# Patient Record
Sex: Male | Born: 1955
Health system: Southern US, Community
[De-identification: ages and names within clinical notes are randomized; demographics above are authoritative.]

## PROBLEM LIST (undated history)

## (undated) DIAGNOSIS — I6521 Occlusion and stenosis of right carotid artery: Secondary | ICD-10-CM

## (undated) DIAGNOSIS — E786 Lipoprotein deficiency: Secondary | ICD-10-CM

## (undated) DIAGNOSIS — R0789 Other chest pain: Secondary | ICD-10-CM

## (undated) DIAGNOSIS — I10 Essential (primary) hypertension: Secondary | ICD-10-CM

## (undated) DIAGNOSIS — K5790 Diverticulosis of intestine, part unspecified, without perforation or abscess without bleeding: Secondary | ICD-10-CM

## (undated) DIAGNOSIS — E785 Hyperlipidemia, unspecified: Secondary | ICD-10-CM

## (undated) DIAGNOSIS — E119 Type 2 diabetes mellitus without complications: Secondary | ICD-10-CM

## (undated) HISTORY — DX: Type 2 diabetes mellitus without complications: E11.9

## (undated) HISTORY — DX: Diverticulosis of intestine, part unspecified, without perforation or abscess without bleeding: K57.90

## (undated) HISTORY — DX: Hyperlipidemia, unspecified: E78.5

## (undated) HISTORY — PX: APPENDECTOMY: SHX54

## (undated) HISTORY — DX: Other chest pain: R07.89

## (undated) HISTORY — DX: Occlusion and stenosis of right carotid artery: I65.21

## (undated) HISTORY — DX: Lipoprotein deficiency: E78.6

## (undated) HISTORY — PX: COLON SURGERY: SHX602

## (undated) HISTORY — DX: Essential (primary) hypertension: I10

---

## 2016-10-06 DIAGNOSIS — E785 Hyperlipidemia, unspecified: Secondary | ICD-10-CM

## 2016-10-06 HISTORY — DX: Hyperlipidemia, unspecified: E78.5

## 2018-06-06 DIAGNOSIS — E786 Lipoprotein deficiency: Secondary | ICD-10-CM | POA: Insufficient documentation

## 2018-06-06 DIAGNOSIS — E119 Type 2 diabetes mellitus without complications: Secondary | ICD-10-CM

## 2018-06-06 DIAGNOSIS — R0789 Other chest pain: Secondary | ICD-10-CM

## 2018-06-06 DIAGNOSIS — K5790 Diverticulosis of intestine, part unspecified, without perforation or abscess without bleeding: Secondary | ICD-10-CM | POA: Insufficient documentation

## 2018-06-06 DIAGNOSIS — I6521 Occlusion and stenosis of right carotid artery: Secondary | ICD-10-CM | POA: Insufficient documentation

## 2018-06-06 DIAGNOSIS — I10 Essential (primary) hypertension: Secondary | ICD-10-CM

## 2018-06-06 HISTORY — DX: Other chest pain: R07.89

## 2018-06-06 HISTORY — DX: Essential (primary) hypertension: I10

## 2018-06-06 HISTORY — DX: Type 2 diabetes mellitus without complications: E11.9

## 2018-06-06 HISTORY — DX: Diverticulosis of intestine, part unspecified, without perforation or abscess without bleeding: K57.90

## 2018-06-06 HISTORY — DX: Lipoprotein deficiency: E78.6

## 2018-06-06 HISTORY — DX: Occlusion and stenosis of right carotid artery: I65.21

## 2018-06-25 NOTE — Progress Notes (Deleted)
Cardiology Office Note:    Date:  06/25/2018   ID:  Gary Hoover, DOB 12/12/1956, MRN [id number]  PCP:  Patient, No Pcp Per  Cardiologist:  Shirlee More, MD    Referring MD: No ref. provider found    ASSESSMENT:    No diagnosis found. PLAN:    In order of problems listed above:  1. ***   Next appointment: ***   Medication Adjustments/Labs and Tests Ordered: Current medicines are reviewed at length with the patient today.  Concerns regarding medicines are outlined above.  No orders of the defined types were placed in this encounter.  No orders of the defined types were placed in this encounter.   No chief complaint on file.   History of Present Illness:    Gary Hoover is a 62 y.o. male with a hx of dyslipidemia, hypertension type 2 diabetes mellitus and atypical chest pain last seen 10/09/17 at [facility]. Compliance with diet, lifestyle and medications: *** Past Medical History:  Diagnosis Date  . Atherosclerosis of right carotid artery 06/06/2018  . Atypical chest pain 06/06/2018  . Diverticular disease 06/06/2018  . Hyperlipemia 10/06/2016   Statin intolerant  . Hypertension 06/06/2018  . Low level of high density lipoprotein (HDL) 06/06/2018  . Type 2 diabetes mellitus ([place]) 06/06/2018    *** The histories are not reviewed yet. Please review them in the "History" navigator section and refresh this [place].  Current Medications: No outpatient medications have been marked as taking for the 06/26/18 encounter (Appointment) with Richardo Priest, MD.     Allergies:   Patient has no allergy information on record.   Social History   Socioeconomic History  . Marital status: Married    Spouse name: Not on file  . Number of children: Not on file  . Years of education: Not on file  . Highest education level: Not on file  Occupational History  . Not on file  Social Needs  . Financial resource strain: Not on file  . Food insecurity:    Worry: Not on  file    Inability: Not on file  . Transportation needs:    Medical: Not on file    Non-medical: Not on file  Tobacco Use  . Smoking status: Not on file  Substance and Sexual Activity  . Alcohol use: Not on file  . Drug use: Not on file  . Sexual activity: Not on file  Lifestyle  . Physical activity:    Days per week: Not on file    Minutes per session: Not on file  . Stress: Not on file  Relationships  . Social connections:    Talks on phone: Not on file    Gets together: Not on file    Attends religious service: Not on file    Active member of club or organization: Not on file    Attends meetings of clubs or organizations: Not on file    Relationship status: Not on file  Other Topics Concern  . Not on file  Social History Narrative  . Not on file     Family History: The patient's ***family history is not on file. ROS:   Please see the history of present illness.    All other systems reviewed and are negative.  EKGs/Labs/Other Studies Reviewed:    The following studies were reviewed today:  EKG:  EKG ordered today.  The ekg ordered today demonstrates ***  Recent Labs: No results found for requested labs within last 8760  hours.  Recent Lipid Panel No results found for: CHOL, TRIG, HDL, CHOLHDL, VLDL, LDLCALC, LDLDIRECT  Physical Exam:    VS:  There were no vitals taken for this visit.    Wt Readings from Last 3 Encounters:  No data found for Wt     GEN: *** Well nourished, well developed in no acute distress HEENT: Normal NECK: No JVD; No carotid bruits LYMPHATICS: No lymphadenopathy CARDIAC: ***RRR, no murmurs, rubs, gallops RESPIRATORY:  Clear to auscultation without rales, wheezing or rhonchi  ABDOMEN: Soft, non-tender, non-distended MUSCULOSKELETAL:  No edema; No deformity  SKIN: Warm and dry NEUROLOGIC:  Alert and oriented x 3 PSYCHIATRIC:  Normal affect    Signed, Shirlee More, MD  06/25/2018 7:56 AM    [place]

## 2018-06-26 ENCOUNTER — Ambulatory Visit: Payer: Self-pay | Admitting: Cardiology

## 2018-06-27 ENCOUNTER — Encounter: Payer: Self-pay | Admitting: Cardiology

## 2018-06-27 ENCOUNTER — Ambulatory Visit (INDEPENDENT_AMBULATORY_CARE_PROVIDER_SITE_OTHER): Payer: Self-pay | Admitting: Cardiology

## 2018-06-27 VITALS — BP 118/76 | HR 73 | Ht 66.0 in | Wt 149.0 lb

## 2018-06-27 DIAGNOSIS — I10 Essential (primary) hypertension: Secondary | ICD-10-CM

## 2018-06-27 DIAGNOSIS — E785 Hyperlipidemia, unspecified: Secondary | ICD-10-CM

## 2018-06-27 NOTE — Progress Notes (Signed)
Cardiology Office Note:    Date:  06/27/2018   ID:  Gary Hoover, DOB [birth year], MRN [id number]  PCP:  Gary Dress, MD  Cardiologist:  Shirlee More, MD    Referring MD: No ref. provider found    ASSESSMENT:    1. Dyslipidemia   2. Essential hypertension    PLAN:    In order of problems listed above:  His lipid disorder is low HDL took a statin in the past and attributes of bowel perforation to it and will not accept lipid-lowering for treatment.  We discussed performing a advanced lipid profile and lipoprotein a high sensitive CRP he is pleased with his response to lifestyle modification weight loss and exercise and will hold at this time.  We discussed the potential treatment of the future with CTEP agent 1 of which is pending approval at the FDA  Hypertension stable at target with weight loss and exercise does not require antihypertensive drug therapy at this time  Next appointment: One year   Medication Adjustments/Labs and Tests Ordered: Current medicines are reviewed at length with the patient today.  Concerns regarding medicines are outlined above.  Orders Placed This Encounter  Procedures  . EKG 12-Lead   No orders of the defined types were placed in this encounter.   Chief Complaint  Patient presents with  . Hyperlipidemia    History of Present Illness:    Gary Hoover is a 62 y.o. male with a hx of dyslipidemia low HDL and high blood pressure last seen more than 3 years ago. Compliance with diet, lifestyle and medications: Yes  He is retired has lost weight exercising regularly pleased with the quality of his life no chest pain dyspnea palpitations syncope or TIA recent labs requested from his PCP office he tells me his total cholesterol and LDL are good still has low HDL level. Past Medical History:  Diagnosis Date  . Atherosclerosis of right carotid artery 06/06/2018  . Atypical chest pain 06/06/2018  . Diverticular disease 06/06/2018  .  Hyperlipemia 10/06/2016   Statin intolerant  . Hypertension 06/06/2018  . Low level of high density lipoprotein (HDL) 06/06/2018  . Type 2 diabetes mellitus ([place]) 06/06/2018    Past Surgical History:  Procedure Laterality Date  . APPENDECTOMY    . COLON SURGERY      Current Medications: Current Meds  Medication Sig  . aspirin EC 81 MG tablet Take 1 tablet by mouth daily.     Allergies:   Patient has no known allergies.   Social History   Socioeconomic History  . Marital status: Married    Spouse name: Not on file  . Number of children: Not on file  . Years of education: Not on file  . Highest education level: Not on file  Occupational History  . Not on file  Social Needs  . Financial resource strain: Not on file  . Food insecurity:    Worry: Not on file    Inability: Not on file  . Transportation needs:    Medical: Not on file    Non-medical: Not on file  Tobacco Use  . Smoking status: Never Smoker  . Smokeless tobacco: Never Used  Substance and Sexual Activity  . Alcohol use: Not on file  . Drug use: Not on file  . Sexual activity: Not on file  Lifestyle  . Physical activity:    Days per week: Not on file    Minutes per session: Not on file  . Stress:  Not on file  Relationships  . Social connections:    Talks on phone: Not on file    Gets together: Not on file    Attends religious service: Not on file    Active member of club or organization: Not on file    Attends meetings of clubs or organizations: Not on file    Relationship status: Not on file  Other Topics Concern  . Not on file  Social History Narrative  . Not on file     Family History: The patient's family history includes Breast cancer in his mother; CAD in his father; Diabetes in his mother; Hypertension in his father and mother. ROS:   Please see the history of present illness.    All other systems reviewed and are negative.  EKGs/Labs/Other Studies Reviewed:    The following studies  were reviewed today:  EKG:  EKG ordered today.  The ekg ordered today demonstrates sinus rhythm normal  Recent Labs: No results found for requested labs within last 8760 hours.  Recent Lipid Panel No results found for: CHOL, TRIG, HDL, CHOLHDL, VLDL, LDLCALC, LDLDIRECT  Physical Exam:    VS:  BP 118/76 (BP Location: Right Arm, Patient Position: Sitting, Cuff Size: Normal)   Pulse 73   Ht 5\' 6"  (1.676 m)   Wt 149 lb (67.6 kg)   SpO2 98%   BMI 24.05 kg/m     Wt Readings from Last 3 Encounters:  06/27/18 149 lb (67.6 kg)     GEN:  Well nourished, well developed in no acute distress HEENT: Normal NECK: No JVD; No carotid bruits LYMPHATICS: No lymphadenopathy CARDIAC: RRR, no murmurs, rubs, gallops RESPIRATORY:  Clear to auscultation without rales, wheezing or rhonchi  ABDOMEN: Soft, non-tender, non-distended MUSCULOSKELETAL:  No edema; No deformity  SKIN: Warm and dry NEUROLOGIC:  Alert and oriented x 3 PSYCHIATRIC:  Normal affect    Signed, Shirlee More, MD  06/27/2018 5:02 PM    [facility]

## 2018-06-27 NOTE — Patient Instructions (Signed)
Medication Instructions:  Your physician recommends that you continue on your current medications as directed. Please refer to the Current Medication list given to you today.  Labwork: None  Testing/Procedures: None  Follow-Up: Your physician recommends that you schedule a follow-up appointment in: 1 year  Any Other Special Instructions Will Be Listed Below (If Applicable).     If you need a refill on your cardiac medications before your next appointment, please call your pharmacy.   [facility]  Ashley A, RN, BSN  

## 2019-06-26 ENCOUNTER — Ambulatory Visit: Payer: Self-pay | Admitting: Cardiology

## 2019-09-17 DIAGNOSIS — Z23 Encounter for immunization: Secondary | ICD-10-CM | POA: Diagnosis not present

## 2019-09-26 DIAGNOSIS — E785 Hyperlipidemia, unspecified: Secondary | ICD-10-CM | POA: Diagnosis not present

## 2019-09-26 DIAGNOSIS — Z1211 Encounter for screening for malignant neoplasm of colon: Secondary | ICD-10-CM | POA: Diagnosis not present

## 2019-09-26 DIAGNOSIS — Z9181 History of falling: Secondary | ICD-10-CM | POA: Diagnosis not present

## 2019-09-26 DIAGNOSIS — Z0000 Encounter for general adult medical examination without abnormal findings: Secondary | ICD-10-CM | POA: Diagnosis not present

## 2019-09-26 DIAGNOSIS — R7301 Impaired fasting glucose: Secondary | ICD-10-CM | POA: Diagnosis not present

## 2019-12-10 DIAGNOSIS — Z20828 Contact with and (suspected) exposure to other viral communicable diseases: Secondary | ICD-10-CM | POA: Diagnosis not present

## 2019-12-10 DIAGNOSIS — R519 Headache, unspecified: Secondary | ICD-10-CM | POA: Diagnosis not present

## 2019-12-10 DIAGNOSIS — R070 Pain in throat: Secondary | ICD-10-CM | POA: Diagnosis not present

## 2020-01-07 DIAGNOSIS — K219 Gastro-esophageal reflux disease without esophagitis: Secondary | ICD-10-CM | POA: Diagnosis not present

## 2020-01-07 DIAGNOSIS — K5730 Diverticulosis of large intestine without perforation or abscess without bleeding: Secondary | ICD-10-CM | POA: Diagnosis not present

## 2020-01-13 DIAGNOSIS — Z1159 Encounter for screening for other viral diseases: Secondary | ICD-10-CM | POA: Diagnosis not present

## 2020-01-13 DIAGNOSIS — Z20828 Contact with and (suspected) exposure to other viral communicable diseases: Secondary | ICD-10-CM | POA: Diagnosis not present

## 2020-01-20 DIAGNOSIS — Z1211 Encounter for screening for malignant neoplasm of colon: Secondary | ICD-10-CM | POA: Diagnosis not present

## 2020-01-20 DIAGNOSIS — Z9049 Acquired absence of other specified parts of digestive tract: Secondary | ICD-10-CM | POA: Diagnosis not present

## 2020-01-20 DIAGNOSIS — D126 Benign neoplasm of colon, unspecified: Secondary | ICD-10-CM | POA: Diagnosis not present

## 2020-01-20 DIAGNOSIS — Z7982 Long term (current) use of aspirin: Secondary | ICD-10-CM | POA: Diagnosis not present

## 2020-01-20 DIAGNOSIS — K5730 Diverticulosis of large intestine without perforation or abscess without bleeding: Secondary | ICD-10-CM | POA: Diagnosis not present

## 2020-02-12 ENCOUNTER — Other Ambulatory Visit: Payer: Self-pay

## 2020-02-12 ENCOUNTER — Encounter: Payer: Self-pay | Admitting: Cardiology

## 2020-02-12 ENCOUNTER — Ambulatory Visit (INDEPENDENT_AMBULATORY_CARE_PROVIDER_SITE_OTHER): Payer: PPO | Admitting: Cardiology

## 2020-02-12 VITALS — BP 110/62 | HR 63 | Temp 97.7°F | Ht 66.0 in | Wt 153.0 lb

## 2020-02-12 DIAGNOSIS — R0789 Other chest pain: Secondary | ICD-10-CM

## 2020-02-12 DIAGNOSIS — E785 Hyperlipidemia, unspecified: Secondary | ICD-10-CM

## 2020-02-12 NOTE — Progress Notes (Signed)
Cardiology Office Note:    Date:  02/12/2020   ID:  [name], DOB 1955/12/31, MRN [id number]  PCP:  Nicoletta Dress, MD  Cardiologist:  Shirlee More, MD    Referring MD: Nicoletta Dress, MD    ASSESSMENT:    1. Dyslipidemia   2. Other chest pain    PLAN:    In order of problems listed above:  1. We are trying to decide whether he is closer to mild to moderate cardiovascular risk of moderate to severe receptive impact decision making for alternate therapy like PCSK9 CT calcium score performed score is greater than 10 initiate therapy.   Next appointment: 1 year   Medication Adjustments/Labs and Tests Ordered: Current medicines are reviewed at length with the patient today.  Concerns regarding medicines are outlined above.  Orders Placed This Encounter  Procedures  . CT CARDIAC SCORING  . EKG 12-Lead   No orders of the defined types were placed in this encounter.   No chief complaint on file.   History of Present Illness:    Gary Hoover is a 64 y.o. male with a hx of dyslipidemia low HDL and high blood pressure  last seen 06/27/2018. Compliance with diet, lifestyle and medications: Yes  He has had a good year no chest pain shortness of breath palpitation or syncope.  His most recent lipid profile performed in his PCP office 09/26/2019 shows cholesterol 243 HDL 39 LDL 151 with normal renal function.  He is resistant to lipid-lowering therapy as he attributes his bowel preparation to a statin and says he has been intolerant of even a low intensity statin pravastatin due to muscle.  To better define his risk we will do a cardiac CT calcium score is less than 10 avoid lipid-lowering therapy and if greater he would benefit and I would utilize PCSK9 therapy. Past Medical History:  Diagnosis Date  . Atherosclerosis of right carotid artery 06/06/2018  . Atypical chest pain 06/06/2018  . Diverticular disease 06/06/2018  . Hyperlipemia 10/06/2016   Statin intolerant   . Hypertension 06/06/2018  . Low level of high density lipoprotein (HDL) 06/06/2018  . Type 2 diabetes mellitus ([place]) 06/06/2018    Past Surgical History:  Procedure Laterality Date  . APPENDECTOMY    . COLON SURGERY      Current Medications: Current Meds  Medication Sig  . aspirin EC 81 MG tablet Take 1 tablet by mouth daily.     Allergies:   Patient has no known allergies.   Social History   Socioeconomic History  . Marital status: Married    Spouse name: Not on file  . Number of children: Not on file  . Years of education: Not on file  . Highest education level: Not on file  Occupational History  . Not on file  Tobacco Use  . Smoking status: Never Smoker  . Smokeless tobacco: Never Used  Substance and Sexual Activity  . Alcohol use: Yes    Alcohol/week: 1.0 standard drinks    Types: 1 Glasses of wine per week  . Drug use: Never  . Sexual activity: Not Currently  Other Topics Concern  . Not on file  Social History Narrative  . Not on file   Social Determinants of Health   Financial Resource Strain:   . Difficulty of Paying Living Expenses: Not on file  Food Insecurity:   . Worried About Charity fundraiser in the Last Year: Not on file  . Ran Out of  Food in the Last Year: Not on file  Transportation Needs:   . Lack of Transportation (Medical): Not on file  . Lack of Transportation (Non-Medical): Not on file  Physical Activity:   . Days of Exercise per Week: Not on file  . Minutes of Exercise per Session: Not on file  Stress:   . Feeling of Stress : Not on file  Social Connections:   . Frequency of Communication with Friends and Family: Not on file  . Frequency of Social Gatherings with Friends and Family: Not on file  . Attends Religious Services: Not on file  . Active Member of Clubs or Organizations: Not on file  . Attends Archivist Meetings: Not on file  . Marital Status: Not on file     Family History: The patient's family history  includes Breast cancer in his mother; CAD in his father; Diabetes in his mother; Hypertension in his father and mother. ROS:   Please see the history of present illness.    All other systems reviewed and are negative.  EKGs/Labs/Other Studies Reviewed:    The following studies were reviewed today:  EKG:  EKG ordered today and personally reviewed.  The ekg ordered today demonstrates sinus rhythm and remains normal    Physical Exam:    VS:  BP 110/62   Pulse 63   Temp 97.7 F (36.5 C)   Ht 5\' 6"  (1.676 m)   Wt 153 lb (69.4 kg)   SpO2 98%   BMI 24.69 kg/m     Wt Readings from Last 3 Encounters:  02/12/20 153 lb (69.4 kg)  06/27/18 149 lb (67.6 kg)     GEN:  Well nourished, well developed in no acute distress HEENT: Normal NECK: No JVD; No carotid bruits LYMPHATICS: No lymphadenopathy CARDIAC: RRR, no murmurs, rubs, gallops RESPIRATORY:  Clear to auscultation without rales, wheezing or rhonchi  ABDOMEN: Soft, non-tender, non-distended MUSCULOSKELETAL:  No edema; No deformity  SKIN: Warm and dry NEUROLOGIC:  Alert and oriented x 3 PSYCHIATRIC:  Normal affect    Signed, Shirlee More, MD  02/12/2020 2:29 PM    [place]

## 2020-02-12 NOTE — Patient Instructions (Signed)
Medication Instructions:  Your physician recommends that you continue on your current medications as directed. Please refer to the Current Medication list given to you today.  *If you need a refill on your cardiac medications before your next appointment, please call your pharmacy*  Lab Work: None ordered  If you have labs (blood work) drawn today and your tests are completely normal, you will receive your results only by: Marland Kitchen MyChart Message (if you have MyChart) OR . A paper copy in the mail If you have any lab test that is abnormal or we need to change your treatment, we will call you to review the results.  Testing/Procedures: Your physician has recommended you have a Cardiac CT Calcium Scoring done at our [facility]. Location in [place], [place]: At [facility], you and your health needs are our priority.  As part of our continuing mission to provide you with exceptional heart care, we have created designated Provider Care Teams.  These Care Teams include your primary Cardiologist (physician) and Advanced Practice Providers (APPs -  Physician Assistants and Nurse Practitioners) who all work together to provide you with the care you need, when you need it.  Your next appointment:   12 month(s)  The format for your next appointment:   In Person  Provider:   Shirlee More, MD  Other Instructions

## 2020-08-19 DIAGNOSIS — Z1331 Encounter for screening for depression: Secondary | ICD-10-CM | POA: Diagnosis not present

## 2020-08-19 DIAGNOSIS — Z0000 Encounter for general adult medical examination without abnormal findings: Secondary | ICD-10-CM | POA: Diagnosis not present

## 2020-08-19 DIAGNOSIS — Z139 Encounter for screening, unspecified: Secondary | ICD-10-CM | POA: Diagnosis not present

## 2020-08-19 DIAGNOSIS — E785 Hyperlipidemia, unspecified: Secondary | ICD-10-CM | POA: Diagnosis not present

## 2020-08-19 DIAGNOSIS — Z9181 History of falling: Secondary | ICD-10-CM | POA: Diagnosis not present

## 2020-10-22 DIAGNOSIS — Z23 Encounter for immunization: Secondary | ICD-10-CM | POA: Diagnosis not present

## 2020-12-04 DIAGNOSIS — Z6825 Body mass index (BMI) 25.0-25.9, adult: Secondary | ICD-10-CM | POA: Diagnosis not present

## 2020-12-04 DIAGNOSIS — B9689 Other specified bacterial agents as the cause of diseases classified elsewhere: Secondary | ICD-10-CM | POA: Diagnosis not present

## 2020-12-04 DIAGNOSIS — J019 Acute sinusitis, unspecified: Secondary | ICD-10-CM | POA: Diagnosis not present

## 2021-05-11 NOTE — Progress Notes (Signed)
Cardiology Office Note:    Date:  05/12/2021   ID:  Gary Hoover, DOB [birth year], MRN [id number]  PCP:  Nicoletta Dress, MD  Cardiologist:  Shirlee More, MD    Referring MD: Nicoletta Dress, MD    ASSESSMENT:    1. Cardiac risk counseling   2. Primary hypertension   3. Hyperlipidemia, unspecified hyperlipidemia type    PLAN:    In order of problems listed above:  1. See below recheck lipids check coronary calcium score decision regarding statin therapy 2. New right bundle branch   Next appointment: 6 months   Medication Adjustments/Labs and Tests Ordered: Current medicines are reviewed at length with the patient today.  Concerns regarding medicines are outlined above.  Orders Placed This Encounter  Procedures  . CT CARDIAC SCORING (SELF PAY ONLY)  . Comprehensive metabolic panel  . Lipid panel  . EKG 12-Lead   No orders of the defined types were placed in this encounter.   Chief Complaint  Patient presents with  . Follow-up  . Hyperlipidemia    History of Present Illness:    Gary Hoover is a 65 y.o. male with a hx of dyslipidemia with low HDL and hypertension last seen 02/12/2020.  At his last visit I had asked him to have a coronary CT calcium score done to guide whether initiate treatment it was not performed  Most recent labs 09/26/2019: Cholesterol 243 LDL 177 triglycerides 144 HDL 39 A1c 5.7%  Compliance with diet, lifestyle and medications: Yes  In the interim-year-old [facility] screening tells me it was normal we will bring a copy to the office He has no cardiovascular symptoms of chest pain shortness of breath palpitation or syncope He never had a coronary calcium score he said he was never called for scheduling. He is still interested in refining his cardiovascular risk we will recheck a lipid profile today he has been taking over-the-counter fiber hoping that will improve his numbers exercising following a good diet and we will go ahead and  do the coronary calcium score to decide whether he should be on a statin. Past Medical History:  Diagnosis Date  . Atherosclerosis of right carotid artery 06/06/2018  . Atypical chest pain 06/06/2018  . Diverticular disease 06/06/2018  . Hyperlipemia 10/06/2016   Statin intolerant  . Hypertension 06/06/2018  . Low level of high density lipoprotein (HDL) 06/06/2018  . Type 2 diabetes mellitus ([place]) 06/06/2018    Past Surgical History:  Procedure Laterality Date  . APPENDECTOMY    . COLON SURGERY      Current Medications: Current Meds  Medication Sig  . aspirin EC 81 MG tablet Take 1 tablet by mouth daily.  . PSYLLIUM HUSK PO Take by mouth daily.     Allergies:   Patient has no known allergies.   Social History   Socioeconomic History  . Marital status: Married    Spouse name: Not on file  . Number of children: Not on file  . Years of education: Not on file  . Highest education level: Not on file  Occupational History  . Not on file  Tobacco Use  . Smoking status: Never Smoker  . Smokeless tobacco: Never Used  Vaping Use  . Vaping Use: Never used  Substance and Sexual Activity  . Alcohol use: Yes    Alcohol/week: 1.0 standard drink    Types: 1 Glasses of wine per week  . Drug use: Never  . Sexual activity: Not Currently  Other Topics  Concern  . Not on file  Social History Narrative  . Not on file   Social Determinants of Health   Financial Resource Strain: Not on file  Food Insecurity: Not on file  Transportation Needs: Not on file  Physical Activity: Not on file  Stress: Not on file  Social Connections: Not on file     Family History: The patient's family history includes Breast cancer in his mother; CAD in his father; Diabetes in his mother; Hypertension in his father and mother. ROS:   Please see the history of present illness.    All other systems reviewed and are negative.  EKGs/Labs/Other Studies Reviewed:    The following studies were reviewed  today:  EKG:  EKG ordered today and personally reviewed.  The ekg ordered today demonstrates sinus rhythm right bundle branch block new since his EKG February 2021    Physical Exam:    VS:  BP 110/78 (BP Location: Right Arm, Patient Position: Sitting, Cuff Size: Normal)   Pulse 60   Ht 5\' 6"  (1.676 m)   Wt 153 lb 3.2 oz (69.5 kg)   SpO2 98%   BMI 24.73 kg/m     Wt Readings from Last 3 Encounters:  05/12/21 153 lb 3.2 oz (69.5 kg)  02/12/20 153 lb (69.4 kg)  06/27/18 149 lb (67.6 kg)     GEN: He has no xanthoma or xanthelasma well nourished, well developed in no acute distress HEENT: Normal NECK: No JVD; No carotid bruits LYMPHATICS: No lymphadenopathy CARDIAC: RRR, no murmurs, rubs, gallops RESPIRATORY:  Clear to auscultation without rales, wheezing or rhonchi  ABDOMEN: Soft, non-tender, non-distended MUSCULOSKELETAL:  No edema; No deformity  SKIN: Warm and dry NEUROLOGIC:  Alert and oriented x 3 PSYCHIATRIC:  Normal affect    Signed, Shirlee More, MD  05/12/2021 8:21 AM    [place]

## 2021-05-12 ENCOUNTER — Other Ambulatory Visit: Payer: Self-pay

## 2021-05-12 ENCOUNTER — Encounter: Payer: Self-pay | Admitting: Cardiology

## 2021-05-12 ENCOUNTER — Ambulatory Visit: Payer: PPO | Admitting: Cardiology

## 2021-05-12 ENCOUNTER — Telehealth: Payer: Self-pay

## 2021-05-12 VITALS — BP 110/78 | HR 60 | Ht 66.0 in | Wt 153.2 lb

## 2021-05-12 DIAGNOSIS — E785 Hyperlipidemia, unspecified: Secondary | ICD-10-CM

## 2021-05-12 DIAGNOSIS — I10 Essential (primary) hypertension: Secondary | ICD-10-CM | POA: Diagnosis not present

## 2021-05-12 DIAGNOSIS — Z7189 Other specified counseling: Secondary | ICD-10-CM

## 2021-05-12 LAB — COMPREHENSIVE METABOLIC PANEL
ALT: 18 IU/L (ref 0–44)
AST: 25 IU/L (ref 0–40)
Albumin/Globulin Ratio: 2.4 — ABNORMAL HIGH (ref 1.2–2.2)
Albumin: 4.5 g/dL (ref 3.8–4.8)
Alkaline Phosphatase: 56 IU/L (ref 44–121)
BUN/Creatinine Ratio: 18 (ref 10–24)
BUN: 14 mg/dL (ref 8–27)
Bilirubin Total: 0.5 mg/dL (ref 0.0–1.2)
CO2: 23 mmol/L (ref 20–29)
Calcium: 9.3 mg/dL (ref 8.6–10.2)
Chloride: 103 mmol/L (ref 96–106)
Creatinine, Ser: 0.78 mg/dL (ref 0.76–1.27)
Globulin, Total: 1.9 g/dL (ref 1.5–4.5)
Glucose: 85 mg/dL (ref 65–99)
Potassium: 5.2 mmol/L (ref 3.5–5.2)
Sodium: 139 mmol/L (ref 134–144)
Total Protein: 6.4 g/dL (ref 6.0–8.5)
eGFR: 98 mL/min/{1.73_m2} (ref >59)

## 2021-05-12 LAB — LIPID PANEL
Chol/HDL Ratio: 6.5 ratio — ABNORMAL HIGH (ref 0.0–5.0)
Cholesterol, Total: 215 mg/dL — ABNORMAL HIGH (ref 100–199)
HDL: 33 mg/dL — ABNORMAL LOW (ref >39)
LDL Chol Calc (NIH): 166 mg/dL — ABNORMAL HIGH (ref 0–99)
Triglycerides: 87 mg/dL (ref 0–149)
VLDL Cholesterol Cal: 16 mg/dL (ref 5–40)

## 2021-05-12 NOTE — Patient Instructions (Signed)
Medication Instructions:  Your physician recommends that you continue on your current medications as directed. Please refer to the Current Medication list given to you today.  *If you need a refill on your cardiac medications before your next appointment, please call your pharmacy*   Lab Work: Your physician recommends that you return for lab work in: [place], Lipids If you have labs (blood work) drawn today and your tests are completely normal, you will receive your results only by: Marland Kitchen MyChart Message (if you have MyChart) OR . A paper copy in the mail If you have any lab test that is abnormal or we need to change your treatment, we will call you to review the results.   Testing/Procedures: I have placed the order for you to have a CT calcium score. They will call you to schedule this.    Follow-Up: At [facility], you and your health needs are our priority.  As part of our continuing mission to provide you with exceptional heart care, we have created designated Provider Care Teams.  These Care Teams include your primary Cardiologist (physician) and Advanced Practice Providers (APPs -  Physician Assistants and Nurse Practitioners) who all work together to provide you with the care you need, when you need it.  We recommend signing up for the patient portal called "MyChart".  Sign up information is provided on this After Visit Summary.  MyChart is used to connect with patients for Virtual Visits (Telemedicine).  Patients are able to view lab/test results, encounter notes, upcoming appointments, etc.  Non-urgent messages can be sent to your provider as well.   To learn more about what you can do with MyChart, go to [email or web address].    Your next appointment:   1 year(s)  The format for your next appointment:   In Person  Provider:   Shirlee More, MD   Other Instructions

## 2021-05-12 NOTE — Telephone Encounter (Signed)
-----   Message from Richardo Priest, MD sent at 05/12/2021  4:47 PM EDT ----- Lipids elevated LDL with 10% more than last year 151 Lets wait on the CT coronary calcium score to make a decision.  The remainder of his labs were good.

## 2021-05-12 NOTE — Telephone Encounter (Signed)
Left message on patients voicemail to please return our call.   

## 2021-05-13 ENCOUNTER — Telehealth: Payer: Self-pay

## 2021-05-13 NOTE — Telephone Encounter (Signed)
Spoke with patient regarding results and recommendation.  Patient verbalizes understanding and is agreeable to plan of care. Advised patient to call back with any issues or concerns.  

## 2021-05-13 NOTE — Telephone Encounter (Signed)
-----   Message from Richardo Priest, MD sent at 05/12/2021  4:47 PM EDT ----- Lipids elevated LDL with 10% more than last year 151 Lets wait on the CT coronary calcium score to make a decision.  The remainder of his labs were good.

## 2021-05-26 ENCOUNTER — Telehealth: Payer: Self-pay | Admitting: Cardiology

## 2021-05-26 ENCOUNTER — Other Ambulatory Visit: Payer: Self-pay

## 2021-05-26 ENCOUNTER — Ambulatory Visit (INDEPENDENT_AMBULATORY_CARE_PROVIDER_SITE_OTHER)
Admission: RE | Admit: 2021-05-26 | Discharge: 2021-05-26 | Disposition: A | Discharge status: Home / Self Care | Payer: Self-pay | Source: Ambulatory Visit | Attending: Cardiology | Admitting: Cardiology

## 2021-05-26 DIAGNOSIS — E785 Hyperlipidemia, unspecified: Secondary | ICD-10-CM

## 2021-05-26 DIAGNOSIS — Z7189 Other specified counseling: Secondary | ICD-10-CM

## 2021-05-26 NOTE — Progress Notes (Signed)
Spoke with patient about elevated calcium score and the recommendation to take a stating and patient states, "I will not take a statin, it caused me to be hospitalized before".  Patient is open to other suggestions.  Advised I would forward message to Dr. Bettina Gavia

## 2021-05-26 NOTE — Telephone Encounter (Signed)
   Pt said, he got his CT done today, while he was at [address] he was told he also needs blood work so he was told to go to labs and have it done. He wasn't sure if Dr. Bettina Gavia needs that. He said if he doesn't need it to call [facility] and have his blood destroyed

## 2021-06-07 ENCOUNTER — Telehealth: Payer: Self-pay | Admitting: Cardiology

## 2021-06-07 DIAGNOSIS — E785 Hyperlipidemia, unspecified: Secondary | ICD-10-CM

## 2021-06-07 NOTE — Telephone Encounter (Signed)
   Pt would like to speak with Lilia Pro regarding Lipids and medications

## 2021-06-07 NOTE — Telephone Encounter (Signed)
Spoke to the patient just now. He let me know that he is unable to take any kind of statin and he also is unable to take Zetia due to muscle symptoms that he has with them.   He is wondering if the doctor has any recommendations that are not prescription. He is looking for an all natural route. I advised on diet control but he would like to try something else as well.   I let him know that Dr. Bettina Gavia is out of the office this week and will be back on Monday. He states he is fine to wait if need be.   He also would like to know if Dr. Bettina Gavia is able to look over his lifeline paperwork that he dropped off at this office a few weeks ago. I do not recall this paperwork coming across my desk but it may have been given directly to Dr. Bettina Gavia and I will route this message to him.

## 2021-06-13 NOTE — Addendum Note (Signed)
Addended by: Resa Miner I on: 06/13/2021 08:08 AM   Modules accepted: Orders

## 2021-06-13 NOTE — Telephone Encounter (Signed)
Spoke to the patient just now and let him know Dr. Joya Gaskins recommendations. I placed the referral for him and he is agreeable with the plan of care.    Encouraged patient to call back with any questions or concerns.

## 2021-06-29 DIAGNOSIS — R1012 Left upper quadrant pain: Secondary | ICD-10-CM | POA: Diagnosis not present

## 2021-06-29 DIAGNOSIS — K5790 Diverticulosis of intestine, part unspecified, without perforation or abscess without bleeding: Secondary | ICD-10-CM | POA: Diagnosis not present

## 2021-06-29 DIAGNOSIS — K219 Gastro-esophageal reflux disease without esophagitis: Secondary | ICD-10-CM | POA: Diagnosis not present

## 2021-07-10 IMAGING — CT CT CARDIAC CORONARY ARTERY CALCIUM SCORE
3 series · 14 of 20 positions shown, 15 images · non-contrast
Comparison: 09/19/2014
COMPARISON: 09/19/2014

Addendum:
EXAM:
OVER-READ INTERPRETATION  CT CHEST

The following report is an over-read performed by radiologist Dr.
Elinesse Mesa Curbelo [REDACTED] on 05/26/2021. This over-read
does not include interpretation of cardiac or coronary anatomy or
pathology. The coronary calcium score interpretation by the
cardiologist is attached.
CLINICAL DATA: Cardiovascular Disease Risk stratification
Coronary Calcium Score
TECHNIQUE: A gated, non-contrast computed tomography scan of the heart was
performed using 3mm slice thickness. Axial images were analyzed on a
dedicated workstation. Calcium scoring of the coronary arteries was
performed using the Agatston method.

[Series 2: casc 3.0 bv41 2 bestdiast 72 % · axial · 0.43mm/px · z∈[-222,-144]mm · 4 of 44 slices shown, 5 images]
[im 9/44  vessel]
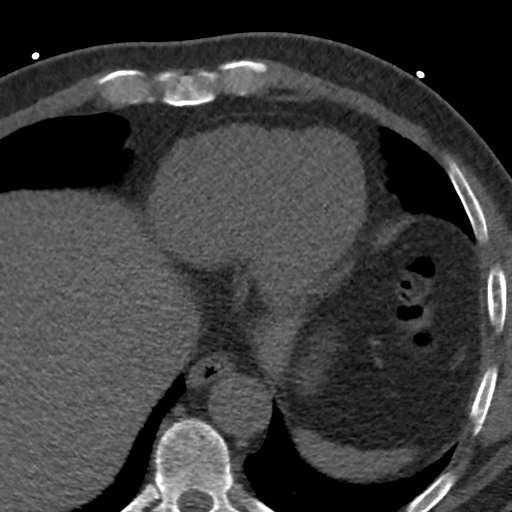
[im 9/44  lung]
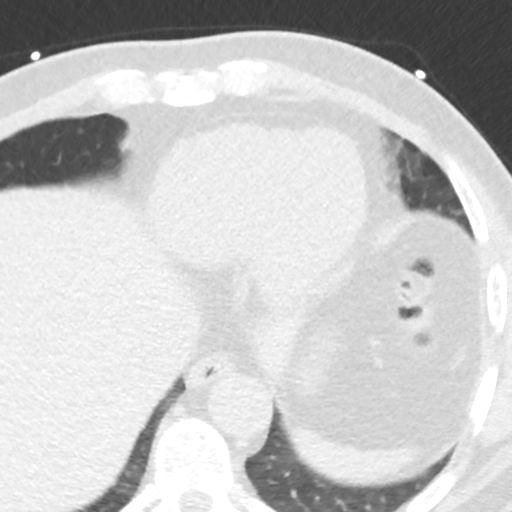
[im 18/44  vessel]
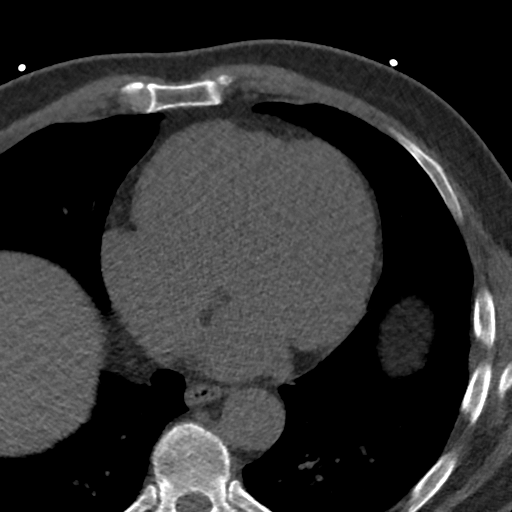
[im 26/44  vessel]
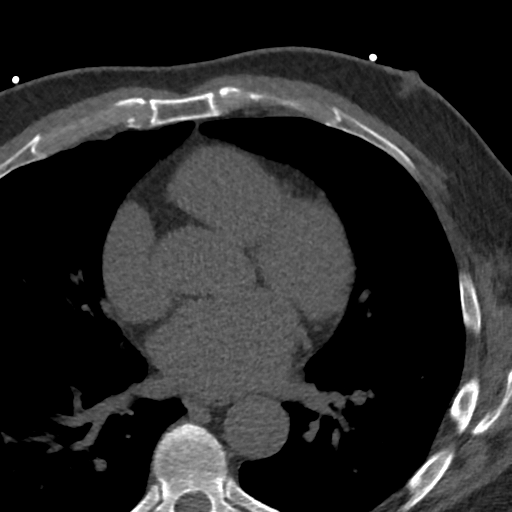
[im 35/44  vessel]
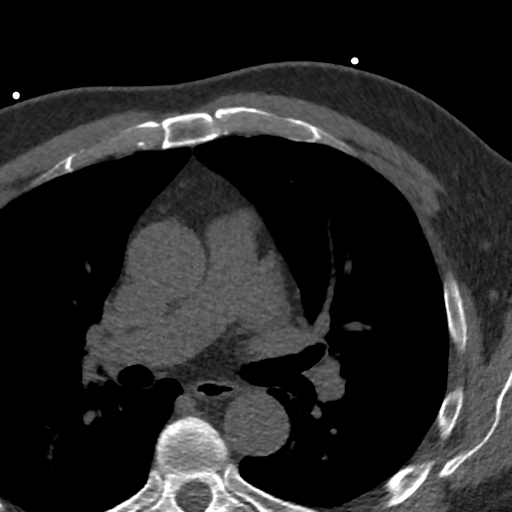

[Series 3: lung 72 % · axial · 0.68mm/px · z∈[-226,-142]mm · 5 of 44 slices shown]
[im 8/44  lung]
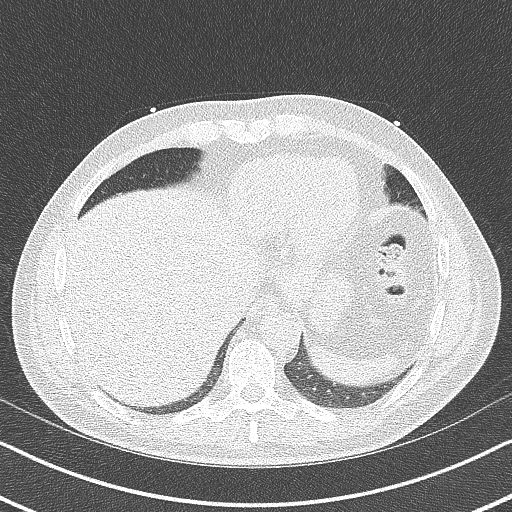
[im 15/44  lung]
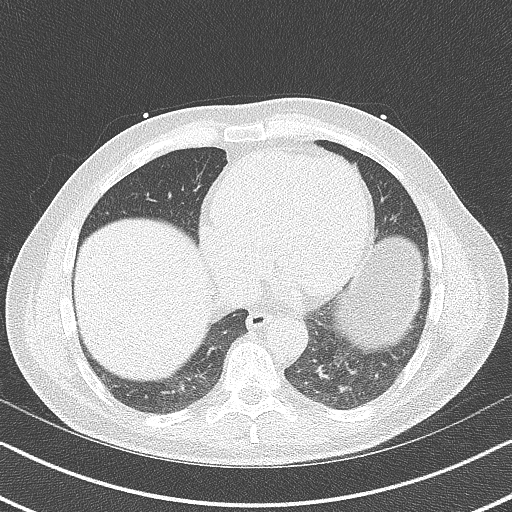
[im 22/44  lung]
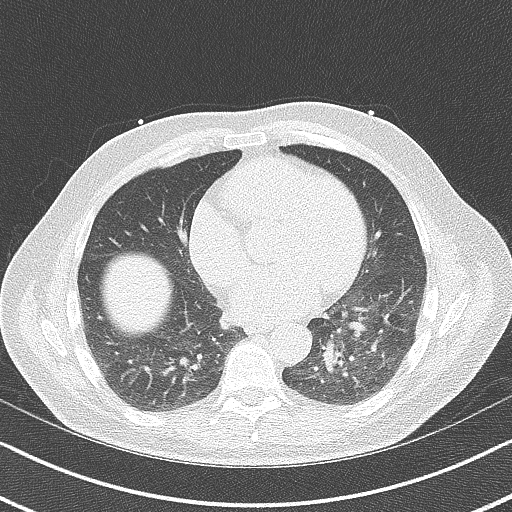
[im 29/44  lung]
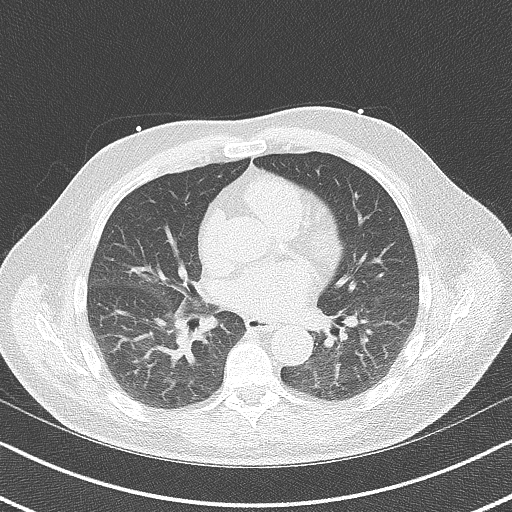
[im 36/44  lung]
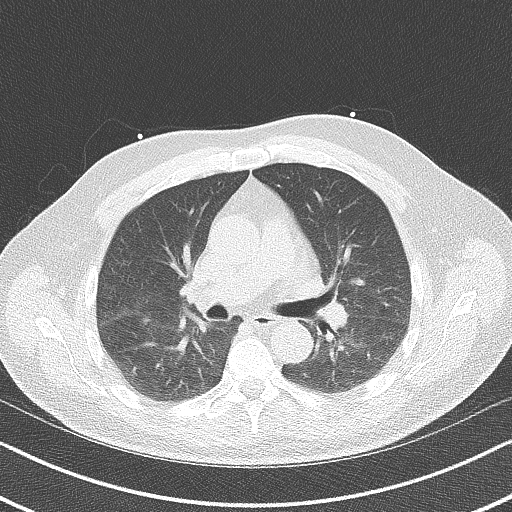

[Series 4: lung st 72 % · axial · 0.68mm/px · z∈[-226,-142]mm · 5 of 44 slices shown]
[im 8/44  lung]
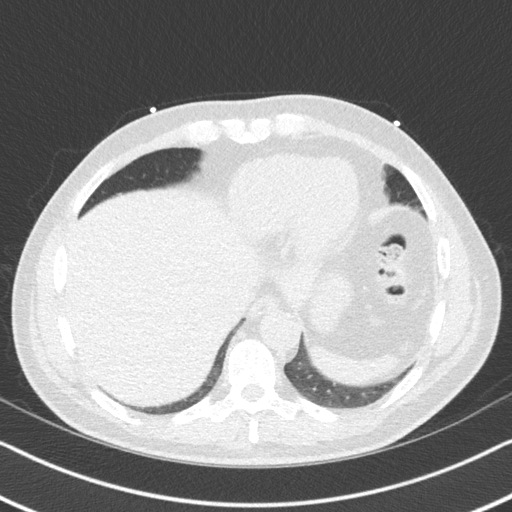
[im 15/44  lung]
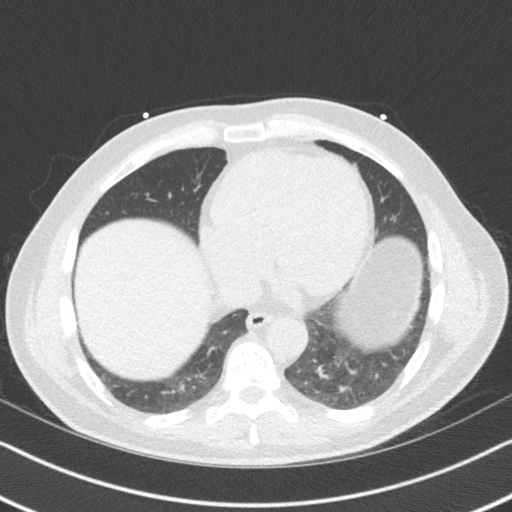
[im 22/44  lung]
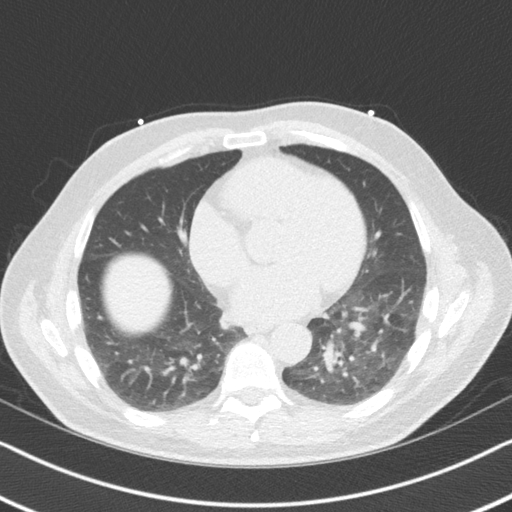
[im 29/44  lung]
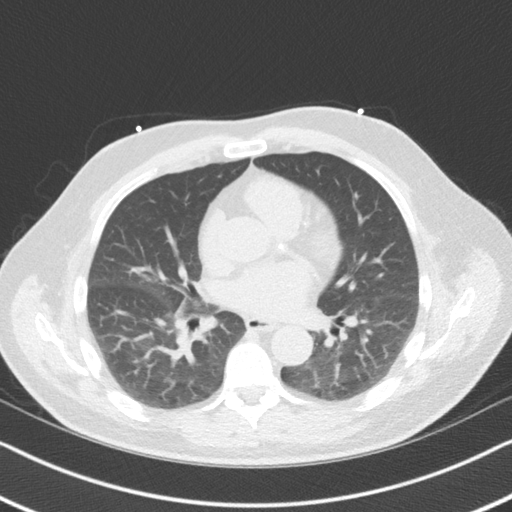
[im 36/44  lung]
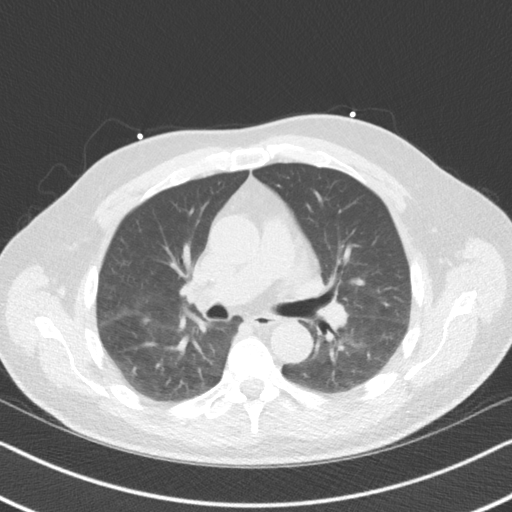

[14 of 20 positions shown; findings below may reference images not displayed]

FINDINGS: Vascular: Heart is normal size. Aorta normal caliber. Scattered
aortic calcifications.

Mediastinum/Nodes: No adenopathy

Lungs/Pleura: No confluent opacities or effusions.

Upper Abdomen: Imaging into the upper abdomen demonstrates no acute
findings.

Musculoskeletal: Chest wall soft tissues are unremarkable. No acute
bony abnormality.
IMPRESSION: No acute extra cardiac abnormality.

Scattered aortic atherosclerosis.
FINDINGS: Coronary arteries: Normal origins.

Coronary Calcium Score:

Left main: 63

Left anterior descending artery: 4

Left circumflex artery: 0

Right coronary artery: 0

Total: 67

Percentile: 46

Pericardium: Normal.

Ascending Aorta: Normal caliber.  Scattered atherosclerosis.

Non-cardiac: See separate report from [REDACTED].
IMPRESSION: 1. Coronary calcium score of 67. This was 46 percentile for age-,
race-, and sex-matched controls.

2.  Aortic atherosclerosis.



If CAC=0, it is reasonable to withhold statin therapy and reassess
in 5 to 10 years, as long as higher risk conditions are absent
(diabetes mellitus, family history of premature CHD in first degree
relatives (males <55 years; females <65 years), cigarette smoking,
or LDL >=190 mg/dL).

If CAC is 1 to 99, it is reasonable to initiate statin therapy for
patients >=55 years of age.

If CAC is >=100 or >=75th percentile, it is reasonable to initiate
statin therapy at any age.

Cardiology referral should be considered for patients with CAC
scores >=400 or >=75th percentile.

*9104 AHA/ACC/AACVPR/AAPA/ABC/DEVPRAKASH/ELVIN [place]/GUIXIAN/Te/NOMASIBULELE/GRANITO URMAS/THAWAB
Guideline on the Management of Blood Cholesterol: A Report of the
American College of Cardiology/American Heart Association Task Force
on Clinical Practice Guidelines. J Am Coll Cardiol.
3136;73(24):4197-4153.

*** End of Addendum ***
EXAM:
OVER-READ INTERPRETATION  CT CHEST

The following report is an over-read performed by radiologist Dr.
Elinesse Mesa Curbelo [REDACTED] on 05/26/2021. This over-read
does not include interpretation of cardiac or coronary anatomy or
pathology. The coronary calcium score interpretation by the
cardiologist is attached.
FINDINGS: Vascular: Heart is normal size. Aorta normal caliber. Scattered
aortic calcifications.

Mediastinum/Nodes: No adenopathy

Lungs/Pleura: No confluent opacities or effusions.

Upper Abdomen: Imaging into the upper abdomen demonstrates no acute
findings.

Musculoskeletal: Chest wall soft tissues are unremarkable. No acute
bony abnormality.
IMPRESSION: No acute extra cardiac abnormality.

Scattered aortic atherosclerosis.

## 2021-08-29 DIAGNOSIS — B9689 Other specified bacterial agents as the cause of diseases classified elsewhere: Secondary | ICD-10-CM | POA: Diagnosis not present

## 2021-08-29 DIAGNOSIS — J019 Acute sinusitis, unspecified: Secondary | ICD-10-CM | POA: Diagnosis not present

## 2021-09-07 DIAGNOSIS — E785 Hyperlipidemia, unspecified: Secondary | ICD-10-CM | POA: Diagnosis not present

## 2021-09-07 DIAGNOSIS — Z1331 Encounter for screening for depression: Secondary | ICD-10-CM | POA: Diagnosis not present

## 2021-09-07 DIAGNOSIS — Z9181 History of falling: Secondary | ICD-10-CM | POA: Diagnosis not present

## 2021-09-07 DIAGNOSIS — Z0000 Encounter for general adult medical examination without abnormal findings: Secondary | ICD-10-CM | POA: Diagnosis not present

## 2021-09-07 DIAGNOSIS — Z139 Encounter for screening, unspecified: Secondary | ICD-10-CM | POA: Diagnosis not present

## 2021-10-21 ENCOUNTER — Telehealth (HOSPITAL_BASED_OUTPATIENT_CLINIC_OR_DEPARTMENT_OTHER): Payer: PPO | Admitting: Internal Medicine

## 2021-10-24 DIAGNOSIS — Z23 Encounter for immunization: Secondary | ICD-10-CM | POA: Diagnosis not present

## 2021-11-16 ENCOUNTER — Encounter (HOSPITAL_BASED_OUTPATIENT_CLINIC_OR_DEPARTMENT_OTHER): Payer: Self-pay | Admitting: Internal Medicine

## 2021-11-16 ENCOUNTER — Other Ambulatory Visit: Payer: Self-pay

## 2021-11-16 ENCOUNTER — Ambulatory Visit (HOSPITAL_BASED_OUTPATIENT_CLINIC_OR_DEPARTMENT_OTHER): Payer: PPO | Admitting: Internal Medicine

## 2021-11-16 VITALS — BP 122/72 | HR 65 | Ht 66.0 in | Wt 150.0 lb

## 2021-11-16 DIAGNOSIS — I6523 Occlusion and stenosis of bilateral carotid arteries: Secondary | ICD-10-CM

## 2021-11-16 DIAGNOSIS — R931 Abnormal findings on diagnostic imaging of heart and coronary circulation: Secondary | ICD-10-CM

## 2021-11-16 DIAGNOSIS — I10 Essential (primary) hypertension: Secondary | ICD-10-CM | POA: Diagnosis not present

## 2021-11-16 DIAGNOSIS — Z789 Other specified health status: Secondary | ICD-10-CM | POA: Diagnosis not present

## 2021-11-16 DIAGNOSIS — E785 Hyperlipidemia, unspecified: Secondary | ICD-10-CM | POA: Diagnosis not present

## 2021-11-16 MED ORDER — LIVALO 2 MG PO TABS: 1.0000 | ORAL_TABLET | Freq: Every day | ORAL | 11 refills | Status: DC

## 2021-11-16 NOTE — Patient Instructions (Signed)
Medication Instructions:  START Liavlo 2mg  daily  The [employer] foundation offers assistance to help pay for medication copays.  They will cover copays for all cholesterol lowering meds, including statins, fibrates, omega-3 fish oils like Vascepa, ezetimibe, Repatha, Praluent, Nexletol, Nexlizet, Livalo.  The cards are usually good for $2,500 or 12 months, whichever comes first. Go to healthwellfoundation.org Click on "Apply Now" Answer questions as to whom is applying (patient or representative) Your disease fund will be "hypercholesterolemia - Medicare access" They will ask questions about finances and which medications you are taking for cholesterol When you submit, the approval is usually within minutes.  You will need to print the card information from the site You will need to show this information to your pharmacy, they will bill your Medicare Part D plan first -then bill [employer] --for the copay.   You can also call them at [phone], although the hold times can be quite long.     *If you need a refill on your cardiac medications before your next appointment, please call your pharmacy*   Lab Work: FASTING lab work in 3-4 months -- complete about 1 week before your next visit  If you have labs (blood work) drawn today and your tests are completely normal, you will receive your results only by: [place] (if you have MyChart) OR A paper copy in the mail If you have any lab test that is abnormal or we need to change your treatment, we will call you to review the results.  Follow-Up: At [facility], you and your health needs are our priority.  As part of our continuing mission to provide you with exceptional heart care, we have created designated Provider Care Teams.  These Care Teams include your primary Cardiologist (physician) and Advanced Practice Providers (APPs -  Physician Assistants and Nurse Practitioners) who all work together to provide you with the care you  need, when you need it.  We recommend signing up for the patient portal called "MyChart".  Sign up information is provided on this After Visit Summary.  MyChart is used to connect with patients for Virtual Visits (Telemedicine).  Patients are able to view lab/test results, encounter notes, upcoming appointments, etc.  Non-urgent messages can be sent to your provider as well.   To learn more about what you can do with MyChart, go to [email or web address].    Your next appointment:   3-4 month(s) - lipid clinic  The format for your next appointment:   In Person  Provider:   K. Mali Hilty, MD{

## 2021-11-16 NOTE — Progress Notes (Signed)
LIPID CLINIC CONSULT NOTE  Chief Complaint:  Manage dyslipidemia  Primary Care Physician: Nicoletta Dress, MD  Primary Cardiologist:  None  HPI:  Gary Hoover is a 65 y.o. male who is being seen today for the evaluation of dyslipidemia at the request of Bettina Gavia, Hilton Cork, MD. this is a pleasant 65 year old male kindly referred for evaluation management of dyslipidemia.  He reports some longstanding abnormalities in cholesterol and presented with a list of prior cholesterols dating back into the early 2000's.  There seems to be a clear trend and increase in cholesterol to highest LDL of 177.  Most recently total cholesterol was 215, HDL 33, LDL 166 and triglycerides 87.  Beyond his high cholesterol he has few traditional cardiac risk factors including no diabetes, hypertension or early onset heart disease.  Both parents lived into their late 57s and 81s.  He did have a coronary calcium score which was 68, 46 percentile for age and sex matched controls.  He also had a [employer] screening exam looking at his carotids because of family history of carotid disease.  He was found to have some mild bilateral carotid artery stenosis based on increased velocities.  Unfortunately he reports statin intolerance.  He says he is tried a number of statins including rosuvastatin, atorvastatin, simvastatin and ezetimibe, all in the past causing significant abdominal cramping and discomfort.  In fact he also had issues with diverticulitis at the time and ultimately required partial colectomy for resolution of his symptoms.  PMHx:  Past Medical History:  Diagnosis Date   Atherosclerosis of right carotid artery 06/06/2018   Atypical chest pain 06/06/2018   Diverticular disease 06/06/2018   Hyperlipemia 10/06/2016   Statin intolerant   Hypertension 06/06/2018   Low level of high density lipoprotein (HDL) 06/06/2018   Type 2 diabetes mellitus ([place]) 06/06/2018    Past Surgical History:  Procedure Laterality  Date   APPENDECTOMY     COLON SURGERY      FAMHx:  Family History  Problem Relation Age of Onset   Breast cancer Mother    Diabetes Mother    Hypertension Mother    Hypertension Father    CAD Father     SOCHx:   reports that he has never smoked. He has never used smokeless tobacco. He reports current alcohol use of about 1.0 standard drink per week. He reports that he does not use drugs.  ALLERGIES:  No Known Allergies  ROS: Pertinent items noted in HPI and remainder of comprehensive ROS otherwise negative.  HOME MEDS: Current Outpatient Medications on File Prior to Visit  Medication Sig Dispense Refill   aspirin EC 81 MG tablet Take 1 tablet by mouth daily.     omeprazole (PRILOSEC) 40 MG capsule Take 40 mg by mouth daily.     PSYLLIUM HUSK PO Take by mouth daily.     No current facility-administered medications on file prior to visit.    LABS/IMAGING: No results found for this or any previous visit (from the past 48 hour(s)). No results found.  LIPID PANEL:    Component Value Date/Time   CHOL 215 (H) 05/12/2021 0848   TRIG 87 05/12/2021 0848   HDL 33 (L) 05/12/2021 0848   CHOLHDL 6.5 (H) 05/12/2021 0848   LDLCALC 166 (H) 05/12/2021 0848    WEIGHTS: Wt Readings from Last 3 Encounters:  11/16/21 150 lb (68 kg)  05/12/21 153 lb 3.2 oz (69.5 kg)  02/12/20 153 lb (69.4 kg)  VITALS: BP 122/72   Pulse 65   Ht 5\' 6"  (1.676 m)   Wt 150 lb (68 kg)   SpO2 95%   BMI 24.21 kg/m   EXAM: General appearance: alert and no distress Neck: no carotid bruit, no JVD, and thyroid not enlarged, symmetric, no tenderness/mass/nodules Lungs: clear to auscultation bilaterally Heart: regular rate and rhythm, S1, S2 normal, no murmur, click, rub or gallop Abdomen: soft, non-tender; bowel sounds normal; no masses,  no organomegaly Extremities: extremities normal, atraumatic, no cyanosis or edema Pulses: 2+ and symmetric Skin: Skin color, texture, turgor normal. No rashes  or lesions Neurologic: Grossly normal Psych: Pleasant  EKG: Deferred  ASSESSMENT: Mixed dyslipidemia, goal LDL less than 100 Mild bilateral carotid artery disease CAC score of 67, 46 percentile Statin and Zetia intolerance-abdominal cramping History of diverticulitis with perforation status post partial colectomy  PLAN: 1.   Mr. Milnes has a mixed dyslipidemia but has been intolerant to many statins and ezetimibe.  He has mild cardiovascular disease including some coronary calcium and some mild carotid artery disease based on Lif[employer] screening.  No family history of early onset heart disease.  I would put him at low to intermediate risk for heart disease.  I would recommend further lipid-lowering with at least reduction in LDL cholesterol below 100.  His options are somewhat limited because he does not have enough disease to really qualify for PCSK9 inhibitor or Nexletol which not really designed for primary therapies.  I would still recommend a statin although he is hesitant, but thought he could consider Livalo given its different mechanism of action.  We will go ahead and try 2 mg daily dosing and plan repeat lipids in about 3 months with follow-up at that time.  Thanks again for the kind referral.  Pixie Casino, MD, FACC, [place] Director of the [facility] Lipid [facility] &  Cardiovascular Risk Reduction Clinic Diplomate of the American Board of Clinical Lipidology Attending Cardiologist  Direct Dial: [phone]  Fax: [phone]  Website:  [email or web address] Nowakowski 11/16/2021, 9:01 AM

## 2021-11-17 DIAGNOSIS — L821 Other seborrheic keratosis: Secondary | ICD-10-CM | POA: Diagnosis not present

## 2021-11-17 DIAGNOSIS — L700 Acne vulgaris: Secondary | ICD-10-CM | POA: Diagnosis not present

## 2021-11-25 DIAGNOSIS — Z0000 Encounter for general adult medical examination without abnormal findings: Secondary | ICD-10-CM | POA: Diagnosis not present

## 2021-11-25 DIAGNOSIS — Z125 Encounter for screening for malignant neoplasm of prostate: Secondary | ICD-10-CM | POA: Diagnosis not present

## 2021-11-25 DIAGNOSIS — Z23 Encounter for immunization: Secondary | ICD-10-CM | POA: Diagnosis not present

## 2022-02-14 ENCOUNTER — Ambulatory Visit (HOSPITAL_BASED_OUTPATIENT_CLINIC_OR_DEPARTMENT_OTHER): Payer: PPO | Admitting: Internal Medicine

## 2022-03-10 DIAGNOSIS — H25813 Combined forms of age-related cataract, bilateral: Secondary | ICD-10-CM | POA: Diagnosis not present

## 2022-03-28 DIAGNOSIS — L570 Actinic keratosis: Secondary | ICD-10-CM | POA: Diagnosis not present

## 2022-03-28 DIAGNOSIS — L720 Epidermal cyst: Secondary | ICD-10-CM | POA: Diagnosis not present

## 2022-03-28 DIAGNOSIS — L821 Other seborrheic keratosis: Secondary | ICD-10-CM | POA: Diagnosis not present

## 2022-03-28 DIAGNOSIS — D1801 Hemangioma of skin and subcutaneous tissue: Secondary | ICD-10-CM | POA: Diagnosis not present

## 2022-03-28 DIAGNOSIS — L738 Other specified follicular disorders: Secondary | ICD-10-CM | POA: Diagnosis not present

## 2022-04-10 DIAGNOSIS — M7702 Medial epicondylitis, left elbow: Secondary | ICD-10-CM | POA: Diagnosis not present

## 2022-04-26 DIAGNOSIS — H25811 Combined forms of age-related cataract, right eye: Secondary | ICD-10-CM | POA: Diagnosis not present

## 2022-04-26 DIAGNOSIS — H25813 Combined forms of age-related cataract, bilateral: Secondary | ICD-10-CM | POA: Diagnosis not present

## 2022-04-26 DIAGNOSIS — Z01818 Encounter for other preprocedural examination: Secondary | ICD-10-CM | POA: Diagnosis not present

## 2022-05-22 DIAGNOSIS — M7702 Medial epicondylitis, left elbow: Secondary | ICD-10-CM | POA: Diagnosis not present

## 2022-05-31 DIAGNOSIS — M7702 Medial epicondylitis, left elbow: Secondary | ICD-10-CM | POA: Diagnosis not present

## 2022-05-31 DIAGNOSIS — R1032 Left lower quadrant pain: Secondary | ICD-10-CM | POA: Diagnosis not present

## 2022-05-31 DIAGNOSIS — R531 Weakness: Secondary | ICD-10-CM | POA: Diagnosis not present

## 2022-05-31 DIAGNOSIS — M25522 Pain in left elbow: Secondary | ICD-10-CM | POA: Diagnosis not present

## 2022-05-31 DIAGNOSIS — R109 Unspecified abdominal pain: Secondary | ICD-10-CM | POA: Diagnosis not present

## 2022-06-07 DIAGNOSIS — M7702 Medial epicondylitis, left elbow: Secondary | ICD-10-CM | POA: Diagnosis not present

## 2022-06-07 DIAGNOSIS — R531 Weakness: Secondary | ICD-10-CM | POA: Diagnosis not present

## 2022-06-07 DIAGNOSIS — M25522 Pain in left elbow: Secondary | ICD-10-CM | POA: Diagnosis not present

## 2022-06-08 DIAGNOSIS — K5790 Diverticulosis of intestine, part unspecified, without perforation or abscess without bleeding: Secondary | ICD-10-CM | POA: Diagnosis not present

## 2022-06-08 DIAGNOSIS — K219 Gastro-esophageal reflux disease without esophagitis: Secondary | ICD-10-CM | POA: Diagnosis not present

## 2022-06-08 DIAGNOSIS — R1032 Left lower quadrant pain: Secondary | ICD-10-CM | POA: Diagnosis not present

## 2022-06-14 DIAGNOSIS — R531 Weakness: Secondary | ICD-10-CM | POA: Diagnosis not present

## 2022-06-14 DIAGNOSIS — M25522 Pain in left elbow: Secondary | ICD-10-CM | POA: Diagnosis not present

## 2022-06-14 DIAGNOSIS — M7702 Medial epicondylitis, left elbow: Secondary | ICD-10-CM | POA: Diagnosis not present

## 2022-07-19 ENCOUNTER — Ambulatory Visit: Payer: PPO | Admitting: Cardiology

## 2022-07-19 ENCOUNTER — Encounter: Payer: Self-pay | Admitting: Cardiology

## 2022-07-19 VITALS — BP 100/70 | HR 67 | Ht 66.0 in | Wt 157.0 lb

## 2022-07-19 DIAGNOSIS — I700 Atherosclerosis of aorta: Secondary | ICD-10-CM | POA: Diagnosis not present

## 2022-07-19 DIAGNOSIS — I6523 Occlusion and stenosis of bilateral carotid arteries: Secondary | ICD-10-CM

## 2022-07-19 DIAGNOSIS — I10 Essential (primary) hypertension: Secondary | ICD-10-CM | POA: Diagnosis not present

## 2022-07-19 DIAGNOSIS — R931 Abnormal findings on diagnostic imaging of heart and coronary circulation: Secondary | ICD-10-CM

## 2022-07-19 DIAGNOSIS — E785 Hyperlipidemia, unspecified: Secondary | ICD-10-CM

## 2022-07-19 MED ORDER — BEMPEDOIC ACID 180 MG PO TABS: 180.0000 mg | ORAL_TABLET | ORAL | 3 refills | Status: DC

## 2022-07-19 NOTE — Patient Instructions (Signed)
Medication Instructions:  Your physician has recommended you make the following change in your medication:   START: Bempedoic Acid 180 mg every M-W-F  *If you need a refill on your cardiac medications before your next appointment, please call your pharmacy*   Lab Work: Your physician recommends that you return for lab work in:   Labs in 2 months: Lipids, CMP, LPa  If you have labs (blood work) drawn today and your tests are completely normal, you will receive your results only by: [place] (if you have [place]) OR A paper copy in the mail If you have any lab test that is abnormal or we need to change your treatment, we will call you to review the results.   Testing/Procedures: None   Follow-Up: At [facility], you and your health needs are our priority.  As part of our continuing mission to provide you with exceptional heart care, we have created designated Provider Care Teams.  These Care Teams include your primary Cardiologist (physician) and Advanced Practice Providers (APPs -  Physician Assistants and Nurse Practitioners) who all work together to provide you with the care you need, when you need it.  We recommend signing up for the patient portal called "MyChart".  Sign up information is provided on this After Visit Summary.  MyChart is used to connect with patients for Virtual Visits (Telemedicine).  Patients are able to view lab/test results, encounter notes, upcoming appointments, etc.  Non-urgent messages can be sent to your provider as well.   To learn more about what you can do with MyChart, go to [email or web address].    Your next appointment:   1 year(s)  The format for your next appointment:   In Person  Provider:   Shirlee More, MD    Other Instructions None  Important Information About Sugar

## 2022-07-19 NOTE — Progress Notes (Signed)
Cardiology Office Note:    Date:  07/19/2022   ID:  Gary Hoover, DOB May 31, 1956, MRN [id number]  PCP:  Nicoletta Dress, MD  Cardiologist:  Shirlee More, MD    Referring MD: Nicoletta Dress, MD    ASSESSMENT:    1. Dyslipidemia, goal LDL below 100   2. Agatston coronary artery calcium score less than 100   3. Primary hypertension   4. Arteriosclerosis of carotid artery, bilateral   5. Atherosclerosis of aorta (HCC)    PLAN:    In order of problems listed above:  He remains extremely reluctant to accept lipid-lowering treatment but agrees to try 3-day wait bempedoic acid if ineffective, initiated PCSK9 inhibitor I would not revisit the issue of statins or Zetia again.  After 2 months we will check a lipid profile LP(a) and CMP Continue low-dose aspirin Currently not on antihypertensive agents blood pressure well controlled with good diet sodium restriction ideal weight and activity   Next appointment: 1 year   Medication Adjustments/Labs and Tests Ordered: Current medicines are reviewed at length with the patient today.  Concerns regarding medicines are outlined above.  Orders Placed This Encounter  Procedures   Lipid Profile   Comp Met (CMET)   Lipoprotein A (LPA)   EKG 12-Lead   Meds ordered this encounter  Medications   Bempedoic Acid 180 MG TABS    Sig: Take 180 mg by mouth every Monday, Wednesday, and Friday.    Dispense:  45 tablet    Refill:  3    Chief Complaint  Patient presents with   Annual Exam   Follow-up    For LLT    History of Present Illness:    Gary Hoover is a 66 y.o. male with a hx of hypertension and hyperlipidemia last seen 05/12/2021. Compliance with diet, lifestyle and medications: No  He was seen in lipid clinic and never started lipid-lowering therapy He is concerned as he has had abdominal pain in the past he took statins and Zetia and we reviewed nonstatin options including bempedoic acid PCSK9 inhibitor and inclisiran.   He will start bempedoic acid 3 days a week with a follow-up lipid profile CMP and LP(a) 2 months He is not having edema shortness of breath chest pain palpitation or syncope   His coronary CT calcium score reported 05/26/2021 total score 67/46 percentile and also had aortic atherosclerosis.  His lipid profile showed an LDL cholesterol 166 hypertension and right bundle branch block last seen 05/12/2021. Past Medical History:  Diagnosis Date   Atherosclerosis of right carotid artery 06/06/2018   Atypical chest pain 06/06/2018   Diverticular disease 06/06/2018   Hyperlipemia 10/06/2016   Statin intolerant   Hypertension 06/06/2018   Low level of high density lipoprotein (HDL) 06/06/2018   Type 2 diabetes mellitus ([place]) 06/06/2018    Past Surgical History:  Procedure Laterality Date   APPENDECTOMY     COLON SURGERY      Current Medications: Current Meds  Medication Sig   aspirin EC 81 MG tablet Take 1 tablet by mouth daily.   Bempedoic Acid 180 MG TABS Take 180 mg by mouth every Monday, Wednesday, and Friday.   omeprazole (PRILOSEC) 40 MG capsule Take 40 mg by mouth daily as needed (acid reflux).   Wheat Dextrin (BENEFIBER PO) Take 1 tablet by mouth daily.     Allergies:   Patient has no known allergies.   Social History   Socioeconomic History   Marital status: Married  Spouse name: Not on file   Number of children: Not on file   Years of education: Not on file   Highest education level: Not on file  Occupational History   Not on file  Tobacco Use   Smoking status: Never   Smokeless tobacco: Never  Vaping Use   Vaping Use: Never used  Substance and Sexual Activity   Alcohol use: Yes    Alcohol/week: 1.0 standard drink of alcohol    Types: 1 Glasses of wine per week   Drug use: Never   Sexual activity: Not Currently  Other Topics Concern   Not on file  Social History Narrative   Not on file   Social Determinants of Health   Financial Resource Strain: Not on file   Food Insecurity: Not on file  Transportation Needs: Not on file  Physical Activity: Not on file  Stress: Not on file  Social Connections: Not on file     Family History: The patient's family history includes Breast cancer in his mother; CAD in his father; Diabetes in his mother; Hypertension in his father and mother. ROS:   Please see the history of present illness.    All other systems reviewed and are negative.  EKGs/Labs/Other Studies Reviewed:    The following studies were reviewed today:  EKG:  EKG ordered today and personally reviewed.  The ekg ordered today demonstrates sinus rhythm remains normal  Recent Labs: No results found for requested labs within last 365 days.  Recent Lipid Panel    Component Value Date/Time   CHOL 215 (H) 05/12/2021 0848   TRIG 87 05/12/2021 0848   HDL 33 (L) 05/12/2021 0848   CHOLHDL 6.5 (H) 05/12/2021 0848   LDLCALC 166 (H) 05/12/2021 0848    Physical Exam:    VS:  BP 100/70 (BP Location: Right Arm, Patient Position: Sitting, Cuff Size: Normal)   Pulse 67   Ht 5' 6"  (1.676 m)   Wt 157 lb (71.2 kg)   SpO2 95%   BMI 25.34 kg/m     Wt Readings from Last 3 Encounters:  07/19/22 157 lb (71.2 kg)  11/16/21 150 lb (68 kg)  05/12/21 153 lb 3.2 oz (69.5 kg)     GEN:  Well nourished, well developed in no acute distress he has no xanthoma or xanthelasma or arcus senilis HEENT: Normal NECK: No JVD; No carotid bruits LYMPHATICS: No lymphadenopathy CARDIAC: RRR, no murmurs, rubs, gallops RESPIRATORY:  Clear to auscultation without rales, wheezing or rhonchi  ABDOMEN: Soft, non-tender, non-distended MUSCULOSKELETAL:  No edema; No deformity  SKIN: Warm and dry NEUROLOGIC:  Alert and oriented x 3 PSYCHIATRIC:  Normal affect    Signed, Shirlee More, MD  07/19/2022 9:45 AM    [place]

## 2022-11-27 DIAGNOSIS — Z0000 Encounter for general adult medical examination without abnormal findings: Secondary | ICD-10-CM | POA: Diagnosis not present

## 2022-11-27 DIAGNOSIS — Z125 Encounter for screening for malignant neoplasm of prostate: Secondary | ICD-10-CM | POA: Diagnosis not present

## 2022-11-29 ENCOUNTER — Telehealth: Payer: Self-pay

## 2022-11-29 NOTE — Telephone Encounter (Signed)
Labs drawn 11/27/2022 with patients pcp received, to Dr Shirlee More for review

## 2023-01-31 DIAGNOSIS — Z01818 Encounter for other preprocedural examination: Secondary | ICD-10-CM | POA: Diagnosis not present

## 2023-01-31 DIAGNOSIS — H25813 Combined forms of age-related cataract, bilateral: Secondary | ICD-10-CM | POA: Diagnosis not present

## 2023-01-31 DIAGNOSIS — H25811 Combined forms of age-related cataract, right eye: Secondary | ICD-10-CM | POA: Diagnosis not present

## 2023-02-05 DIAGNOSIS — H2511 Age-related nuclear cataract, right eye: Secondary | ICD-10-CM | POA: Diagnosis not present

## 2023-02-05 DIAGNOSIS — H2181 Floppy iris syndrome: Secondary | ICD-10-CM | POA: Diagnosis not present

## 2023-02-05 DIAGNOSIS — H25811 Combined forms of age-related cataract, right eye: Secondary | ICD-10-CM | POA: Diagnosis not present

## 2023-02-05 DIAGNOSIS — H269 Unspecified cataract: Secondary | ICD-10-CM | POA: Diagnosis not present

## 2023-02-05 DIAGNOSIS — H5703 Miosis: Secondary | ICD-10-CM | POA: Diagnosis not present

## 2023-02-12 DIAGNOSIS — H2512 Age-related nuclear cataract, left eye: Secondary | ICD-10-CM | POA: Diagnosis not present

## 2023-02-12 DIAGNOSIS — H269 Unspecified cataract: Secondary | ICD-10-CM | POA: Diagnosis not present

## 2023-02-12 DIAGNOSIS — H25812 Combined forms of age-related cataract, left eye: Secondary | ICD-10-CM | POA: Diagnosis not present

## 2023-07-02 DIAGNOSIS — Z7982 Long term (current) use of aspirin: Secondary | ICD-10-CM | POA: Diagnosis not present

## 2023-07-02 DIAGNOSIS — Z9849 Cataract extraction status, unspecified eye: Secondary | ICD-10-CM | POA: Diagnosis not present

## 2023-07-02 DIAGNOSIS — K5900 Constipation, unspecified: Secondary | ICD-10-CM | POA: Diagnosis not present

## 2023-07-02 DIAGNOSIS — K219 Gastro-esophageal reflux disease without esophagitis: Secondary | ICD-10-CM | POA: Diagnosis not present

## 2023-07-02 DIAGNOSIS — E7800 Pure hypercholesterolemia, unspecified: Secondary | ICD-10-CM | POA: Diagnosis not present

## 2023-09-07 DIAGNOSIS — R1032 Left lower quadrant pain: Secondary | ICD-10-CM | POA: Diagnosis not present

## 2023-09-26 NOTE — Progress Notes (Unsigned)
Cardiology Office Note:    Date:  09/26/2023   ID:  Gary Hoover, DOB 1956/09/17, MRN [id number]  PCP:  Paulina Fusi, MD  Cardiologist:  Norman Herrlich, MD    Referring MD: Paulina Fusi, MD    ASSESSMENT:    No diagnosis found. PLAN:    In order of problems listed above:  ***   Next appointment: ***   Medication Adjustments/Labs and Tests Ordered: Current medicines are reviewed at length with the patient today.  Concerns regarding medicines are outlined above.  No orders of the defined types were placed in this encounter.  No orders of the defined types were placed in this encounter.    History of Present Illness:    Gary Hoover is a 67 y.o. male with a hx of hypertension hyperlipidemia artery calcium score 67/46 percentile last seen 07/19/2022. Compliance with diet, lifestyle and medications: *** Past Medical History:  Diagnosis Date   Atherosclerosis of right carotid artery 06/06/2018   Atypical chest pain 06/06/2018   Diverticular disease 06/06/2018   Hyperlipemia 10/06/2016   Statin intolerant   Hypertension 06/06/2018   Low level of high density lipoprotein (HDL) 06/06/2018   Type 2 diabetes mellitus (HCC) 06/06/2018    Current Medications: Current Meds  Medication Sig   aspirin EC 81 MG tablet Take 1 tablet by mouth daily.   Bempedoic Acid 180 MG TABS Take 180 mg by mouth every Monday, Wednesday, and Friday.   omeprazole (PRILOSEC) 40 MG capsule Take 40 mg by mouth daily as needed (acid reflux).   Pitavastatin Calcium (LIVALO) 2 MG TABS Take 1 tablet (2 mg total) by mouth daily.   Wheat Dextrin (BENEFIBER PO) Take 1 tablet by mouth daily.      EKGs/Labs/Other Studies Reviewed:    The following studies were reviewed today:  Cardiac Studies & Procedures          CT SCANS  CT CARDIAC SCORING (SELF PAY ONLY) 05/26/2021  Addendum 05/26/2021 12:51 PM ADDENDUM REPORT: 05/26/2021 12:49  CLINICAL DATA:  Cardiovascular Disease Risk  stratification  EXAM: Coronary Calcium Score  TECHNIQUE: A gated, non-contrast computed tomography scan of the heart was performed using 3mm slice thickness. Axial images were analyzed on a dedicated workstation. Calcium scoring of the coronary arteries was performed using the Agatston method.  FINDINGS: Coronary arteries: Normal origins.  Coronary Calcium Score:  Left main: 63  Left anterior descending artery: 4  Left circumflex artery: 0  Right coronary artery: 0  Total: 67  Percentile: 46  Pericardium: Normal.  Ascending Aorta: Normal caliber.  Scattered atherosclerosis.  Non-cardiac: See separate report from [facility].  IMPRESSION: 1. Coronary calcium score of 67. This was 70 percentile for age-, race-, and sex-matched controls.  2.  Aortic atherosclerosis.  RECOMMENDATIONS: Coronary artery calcium (CAC) score is a strong predictor of incident coronary heart disease (CHD) and provides predictive information beyond traditional risk factors. CAC scoring is reasonable to use in the decision to withhold, postpone, or initiate statin therapy in intermediate-risk or selected borderline-risk asymptomatic adults (age 12-75 years and LDL-C >=70 to <190 mg/dL) who do not have diabetes or established atherosclerotic cardiovascular disease (ASCVD).* In intermediate-risk (10-year ASCVD risk >=7.5% to <20%) adults or selected borderline-risk (10-year ASCVD risk >=5% to <7.5%) adults in whom a CAC score is measured for the purpose of making a treatment decision the following recommendations have been made:  If CAC=0, it is reasonable to withhold statin therapy and reassess in 5 to 10 years, as  long as higher risk conditions are absent (diabetes mellitus, family history of premature CHD in first degree relatives (males <55 years; females <65 years), cigarette smoking, or LDL >=190 mg/dL).  If CAC is 1 to 99, it is reasonable to initiate statin therapy  for patients >=30 years of age.  If CAC is >=100 or >=75th percentile, it is reasonable to initiate statin therapy at any age.  Cardiology referral should be considered for patients with CAC scores >=400 or >=75th percentile.  *2018 AHA/ACC/AACVPR/AAPA/ABC/ACPM/ADA/AGS/APhA/ASPC/NLA/PCNA Guideline on the Management of Blood Cholesterol: A Report of the American College of Cardiology/American Heart Association Task Force on Clinical Practice Guidelines. J Am Coll Cardiol. 2019;73(24):3168-3209.  Donato Schultz, MD   Electronically Signed By: Donato Schultz MD On: 05/26/2021 12:49  Narrative EXAM: OVER-READ INTERPRETATION  CT CHEST  The following report is an over-read performed by radiologist Dr. Charlett Nose of [facility] on 05/26/2021. This over-read does not include interpretation of cardiac or coronary anatomy or pathology. The coronary calcium score interpretation by the cardiologist is attached.  COMPARISON:  09/19/2014  FINDINGS: Vascular: Heart is normal size. Aorta normal caliber. Scattered aortic calcifications.  Mediastinum/Nodes: No adenopathy  Lungs/Pleura: No confluent opacities or effusions.  Upper Abdomen: Imaging into the upper abdomen demonstrates no acute findings.  Musculoskeletal: Chest wall soft tissues are unremarkable. No acute bony abnormality.  IMPRESSION: No acute extra cardiac abnormality.  Scattered aortic atherosclerosis.  Electronically Signed: By: Charlett Nose M.D. On: 05/26/2021 10:15              Recent Labs: No results found for requested labs within last 365 days.  Recent Lipid Panel    Component Value Date/Time   CHOL 215 (H) 05/12/2021 0848   TRIG 87 05/12/2021 0848   HDL 33 (L) 05/12/2021 0848   CHOLHDL 6.5 (H) 05/12/2021 0848   LDLCALC 166 (H) 05/12/2021 0848    Physical Exam:    VS:  There were no vitals taken for this visit.    Wt Readings from Last 3 Encounters:  07/19/22 157 lb (71.2 kg)   11/16/21 150 lb (68 kg)  05/12/21 153 lb 3.2 oz (69.5 kg)     GEN: *** Well nourished, well developed in no acute distress HEENT: Normal NECK: No JVD; No carotid bruits LYMPHATICS: No lymphadenopathy CARDIAC: ***RRR, no murmurs, rubs, gallops RESPIRATORY:  Clear to auscultation without rales, wheezing or rhonchi  ABDOMEN: Soft, non-tender, non-distended MUSCULOSKELETAL:  No edema; No deformity  SKIN: Warm and dry NEUROLOGIC:  Alert and oriented x 3 PSYCHIATRIC:  Normal affect    Signed, Norman Herrlich, MD  09/26/2023 1:56 PM    [facility]

## 2023-09-27 ENCOUNTER — Ambulatory Visit: Payer: PPO | Attending: Cardiology | Admitting: Cardiology

## 2023-09-27 ENCOUNTER — Encounter: Payer: Self-pay | Admitting: Cardiology

## 2023-09-27 VITALS — BP 112/78 | HR 61 | Ht 66.0 in | Wt 151.8 lb

## 2023-09-27 DIAGNOSIS — E785 Hyperlipidemia, unspecified: Secondary | ICD-10-CM

## 2023-09-27 DIAGNOSIS — R931 Abnormal findings on diagnostic imaging of heart and coronary circulation: Secondary | ICD-10-CM | POA: Diagnosis not present

## 2023-09-27 DIAGNOSIS — I4510 Unspecified right bundle-branch block: Secondary | ICD-10-CM | POA: Diagnosis not present

## 2023-09-27 DIAGNOSIS — I10 Essential (primary) hypertension: Secondary | ICD-10-CM

## 2023-09-27 DIAGNOSIS — Z789 Other specified health status: Secondary | ICD-10-CM | POA: Diagnosis not present

## 2023-09-27 MED ORDER — REPATHA SURECLICK 140 MG/ML ~~LOC~~ SOAJ: 140.0000 mg | SUBCUTANEOUS | 1 refills | Status: DC

## 2023-09-27 NOTE — Patient Instructions (Signed)
Medication Instructions:  Your physician has recommended you make the following change in your medication:   START: Repatha 140 mg every month  *If you need a refill on your cardiac medications before your next appointment, please call your pharmacy*   Lab Work: None If you have labs (blood work) drawn today and your tests are completely normal, you will receive your results only by: MyChart Message (if you have MyChart) OR A paper copy in the mail If you have any lab test that is abnormal or we need to change your treatment, we will call you to review the results.   Testing/Procedures: Vasuscreen   Follow-Up: At [facility], you and your health needs are our priority.  As part of our continuing mission to provide you with exceptional heart care, we have created designated Provider Care Teams.  These Care Teams include your primary Cardiologist (physician) and Advanced Practice Providers (APPs -  Physician Assistants and Nurse Practitioners) who all work together to provide you with the care you need, when you need it.  We recommend signing up for the patient portal called "MyChart".  Sign up information is provided on this After Visit Summary.  MyChart is used to connect with patients for Virtual Visits (Telemedicine).  Patients are able to view lab/test results, encounter notes, upcoming appointments, etc.  Non-urgent messages can be sent to your provider as well.   To learn more about what you can do with MyChart, go to [email or web address].    Your next appointment:   1 year(s)  Provider:   Norman Herrlich, MD    Other Instructions None

## 2023-10-01 ENCOUNTER — Telehealth: Payer: Self-pay

## 2023-10-01 ENCOUNTER — Telehealth: Payer: Self-pay | Admitting: Pharmacy Technician

## 2023-10-01 ENCOUNTER — Other Ambulatory Visit (HOSPITAL_COMMUNITY): Payer: Self-pay

## 2023-10-01 DIAGNOSIS — E785 Hyperlipidemia, unspecified: Secondary | ICD-10-CM

## 2023-10-01 DIAGNOSIS — I10 Essential (primary) hypertension: Secondary | ICD-10-CM

## 2023-10-01 NOTE — Telephone Encounter (Signed)
Prior Authorization form/request asks a question that requires your assistance. Please see the question below and advise accordingly.  Need updated labs for repatha

## 2023-10-01 NOTE — Telephone Encounter (Signed)
Pharmacy Patient Advocate Encounter   Received notification from Pt Calls Messages that prior authorization for repatha is required/requested.   Insurance verification completed.   The patient is insured through [facility] ADVANTAGE/RX ADVANCE .   Per test claim: PA required; PA submitted to [facility] ADVANTAGE/RX ADVANCE via CoverMyMeds Key/confirmation #/EOC [id number] Status is pending

## 2023-10-04 ENCOUNTER — Telehealth: Payer: Self-pay | Admitting: Pharmacy Technician

## 2023-10-04 NOTE — Telephone Encounter (Signed)
[facility] called requesting to speak with a pharmacist or tech in regards to patients repatha medication. Want to know if the denial will be appealed

## 2023-10-04 NOTE — Telephone Encounter (Signed)
Pharmacy Patient Advocate Encounter  Received notification from [facility] ADVANTAGE/RX ADVANCE that Prior Authorization for repatha has been DENIED.  Full denial letter will be uploaded to the media tab. See denial reason below.  Denied- pending updated labs  PA #/Case ID/Reference #: [id number]

## 2023-10-08 NOTE — Telephone Encounter (Signed)
LM to return my call. 

## 2023-10-09 NOTE — Telephone Encounter (Signed)
Patient notified of lab needs, he will contact PCP for recent if we can use that information. He also stated, he is statin intolerant, tried and failed years ago. He will also obtain information with PCP for documentation purposes.

## 2023-10-10 ENCOUNTER — Telehealth: Payer: Self-pay | Admitting: Cardiology

## 2023-10-10 NOTE — Telephone Encounter (Signed)
Patient calling to speak to the nurse about the bp work he needs done. Please advise

## 2023-10-10 NOTE — Telephone Encounter (Signed)
Called patient and he reported that he had spoke to [place] earlier about needing lab work to help him qualify to receive Repatha. Patient stated that he would like to move forward with having the lab work done. Spoke to [place] regarding this patient and she put in the lab work he needed to have in Epic so he could qualify to receive Repatha. I relayed this information to the patient and he stated that he would come by the office on 11/5 to have the labs drawn. Patient had no further questions at this time.

## 2023-10-10 NOTE — Addendum Note (Signed)
Addended by: Heywood Bene on: 10/10/2023 11:51 AM   Modules accepted: Orders

## 2023-10-10 NOTE — Telephone Encounter (Signed)
Patient spoke with Roxanne Mins he approves to proceed with labs. Will stop by on November 5th. Lab order on file

## 2023-10-23 ENCOUNTER — Ambulatory Visit: Payer: PPO | Attending: Cardiology

## 2023-10-23 DIAGNOSIS — I4510 Unspecified right bundle-branch block: Secondary | ICD-10-CM

## 2023-10-23 DIAGNOSIS — R931 Abnormal findings on diagnostic imaging of heart and coronary circulation: Secondary | ICD-10-CM

## 2023-10-23 DIAGNOSIS — E785 Hyperlipidemia, unspecified: Secondary | ICD-10-CM | POA: Diagnosis not present

## 2023-10-23 DIAGNOSIS — I10 Essential (primary) hypertension: Secondary | ICD-10-CM

## 2023-10-23 DIAGNOSIS — Z789 Other specified health status: Secondary | ICD-10-CM

## 2023-10-24 LAB — COMPREHENSIVE METABOLIC PANEL
ALT: 20 [IU]/L (ref 0–44)
AST: 21 [IU]/L (ref 0–40)
Albumin: 4.5 g/dL (ref 3.9–4.9)
Alkaline Phosphatase: 66 [IU]/L (ref 44–121)
BUN/Creatinine Ratio: 16 (ref 10–24)
BUN: 12 mg/dL (ref 8–27)
Bilirubin Total: 0.4 mg/dL (ref 0.0–1.2)
CO2: 27 mmol/L (ref 20–29)
Calcium: 10.2 mg/dL (ref 8.6–10.2)
Chloride: 104 mmol/L (ref 96–106)
Creatinine, Ser: 0.73 mg/dL — ABNORMAL LOW (ref 0.76–1.27)
Globulin, Total: 2.2 g/dL (ref 1.5–4.5)
Glucose: 92 mg/dL (ref 70–99)
Potassium: 4.8 mmol/L (ref 3.5–5.2)
Sodium: 141 mmol/L (ref 134–144)
Total Protein: 6.7 g/dL (ref 6.0–8.5)
eGFR: 98 mL/min/{1.73_m2} (ref >59)

## 2023-10-24 LAB — LIPID PANEL
Chol/HDL Ratio: 5.3 ratio — ABNORMAL HIGH (ref 0.0–5.0)
Cholesterol, Total: 218 mg/dL — ABNORMAL HIGH (ref 100–199)
HDL: 41 mg/dL (ref >39)
LDL Chol Calc (NIH): 160 mg/dL — ABNORMAL HIGH (ref 0–99)
Triglycerides: 94 mg/dL (ref 0–149)
VLDL Cholesterol Cal: 17 mg/dL (ref 5–40)

## 2023-11-28 DIAGNOSIS — E785 Hyperlipidemia, unspecified: Secondary | ICD-10-CM | POA: Diagnosis not present

## 2023-11-28 DIAGNOSIS — Z6824 Body mass index (BMI) 24.0-24.9, adult: Secondary | ICD-10-CM | POA: Diagnosis not present

## 2023-11-28 DIAGNOSIS — R7301 Impaired fasting glucose: Secondary | ICD-10-CM | POA: Diagnosis not present

## 2023-11-28 DIAGNOSIS — Z0000 Encounter for general adult medical examination without abnormal findings: Secondary | ICD-10-CM | POA: Diagnosis not present

## 2023-11-28 DIAGNOSIS — Z125 Encounter for screening for malignant neoplasm of prostate: Secondary | ICD-10-CM | POA: Diagnosis not present

## 2023-12-04 ENCOUNTER — Encounter: Payer: Self-pay | Admitting: Cardiology

## 2023-12-14 DIAGNOSIS — B9689 Other specified bacterial agents as the cause of diseases classified elsewhere: Secondary | ICD-10-CM | POA: Diagnosis not present

## 2023-12-14 DIAGNOSIS — R519 Headache, unspecified: Secondary | ICD-10-CM | POA: Diagnosis not present

## 2023-12-14 DIAGNOSIS — J019 Acute sinusitis, unspecified: Secondary | ICD-10-CM | POA: Diagnosis not present

## 2024-02-28 DIAGNOSIS — J069 Acute upper respiratory infection, unspecified: Secondary | ICD-10-CM | POA: Diagnosis not present

## 2024-03-13 DIAGNOSIS — Z961 Presence of intraocular lens: Secondary | ICD-10-CM | POA: Diagnosis not present

## 2024-03-27 DIAGNOSIS — L821 Other seborrheic keratosis: Secondary | ICD-10-CM | POA: Diagnosis not present

## 2024-03-27 DIAGNOSIS — L720 Epidermal cyst: Secondary | ICD-10-CM | POA: Diagnosis not present

## 2024-03-27 DIAGNOSIS — C44319 Basal cell carcinoma of skin of other parts of face: Secondary | ICD-10-CM | POA: Diagnosis not present

## 2024-03-27 DIAGNOSIS — L738 Other specified follicular disorders: Secondary | ICD-10-CM | POA: Diagnosis not present

## 2024-03-27 DIAGNOSIS — Z85828 Personal history of other malignant neoplasm of skin: Secondary | ICD-10-CM | POA: Diagnosis not present

## 2024-03-27 DIAGNOSIS — D1801 Hemangioma of skin and subcutaneous tissue: Secondary | ICD-10-CM | POA: Diagnosis not present

## 2024-03-27 DIAGNOSIS — L565 Disseminated superficial actinic porokeratosis (DSAP): Secondary | ICD-10-CM | POA: Diagnosis not present

## 2024-04-28 DIAGNOSIS — C44319 Basal cell carcinoma of skin of other parts of face: Secondary | ICD-10-CM | POA: Diagnosis not present

## 2024-04-28 DIAGNOSIS — Z85828 Personal history of other malignant neoplasm of skin: Secondary | ICD-10-CM | POA: Diagnosis not present

## 2024-09-03 DIAGNOSIS — H04123 Dry eye syndrome of bilateral lacrimal glands: Secondary | ICD-10-CM | POA: Diagnosis not present

## 2024-10-13 NOTE — Progress Notes (Unsigned)
 Cardiology Office Note:    Date:  10/14/2024   ID:  Gary Hoover, DOB July 12, 1956, MRN [id number]  PCP:  Keren Vicenta BRAVO, MD  Cardiologist:  Redell Leiter, MD    Referring MD: Keren Vicenta BRAVO, MD    ASSESSMENT:    1. Agatston coronary artery calcium score less than 100   2. Dyslipidemia, goal LDL below 100   3. Statin intolerance   4. Primary hypertension   5. RBBB   6. Left carotid artery stenosis   7. Precordial pain    PLAN:    In order of problems listed above:  Unfortunately despite evidence of significant atherosclerosis he is hesitant to except medical therapy and interested in alternative over-the-counter.  I told him these are good interventions in people for primary prevention. He agrees to try nonstatin Zetia follow-up labs including ApoB 6 weeks Currently on no antihypertensive medication Stable EKG pattern Cardiac CTA to evaluate chest pain syndrome   Next appointment: 1 year   Medication Adjustments/Labs and Tests Ordered: Current medicines are reviewed at length with the patient today.  Concerns regarding medicines are outlined above.  Orders Placed This Encounter  Procedures   CT CORONARY MORPH W/CTA COR W/SCORE W/CA W/CM &/OR WO/CM   Lipid Profile   Apolipoprotein B   Basic Metabolic Panel (BMET)   EKG 12-Lead   Meds ordered this encounter  Medications   ezetimibe (ZETIA) 10 MG tablet    Sig: Take 1 tablet (10 mg total) by mouth daily.    Dispense:  90 tablet    Refill:  3   aspirin EC 81 MG tablet    Sig: Take 1 tablet (81 mg total) by mouth daily. Swallow whole.    Dispense:  90 tablet    Refill:  3   metoprolol tartrate (LOPRESSOR) 50 MG tablet    Sig: Take 1 tablet (50 mg total) by mouth once for 1 dose. Please take this medication 2 hours before CT.    Dispense:  1 tablet    Refill:  0     History of Present Illness:    Gary Hoover is a 68 y.o. male with a hx of hypertension hyperlipidemia and a coronary artery calcium  score of 67/46 percentile last seen 09/27/2023.  Following the visit he had a vascular screen performed showing normal abdominal aorta normal ABI bilaterally and carotid disease was present with mild plaque in the right internal carotid artery moderate plaque left internal carotid artery velocities consistent with 40 to 59% stenosis Compliance with diet, lifestyle and medications: Yes  Unfortunately continue to explore treatment for hyperlipidemia and atherosclerosis and is very hesitant to except pharmacologic medications he never took Repatha  he is not taking aspirin daily. I explained the difference between primary and secondary prevention he has both coronary calcification as well as carotid atherosclerosis he agrees to take aspirin 81 mg daily and try nonstatin Zetia. He then asked me if he needs a stress test and tell me he gets intermittent chest tightness not always with physical activity He will be set up for cardiac CTA. Past Medical History:  Diagnosis Date   Atherosclerosis of right carotid artery 06/06/2018   Atypical chest pain 06/06/2018   Diverticular disease 06/06/2018   Hyperlipemia 10/06/2016   Statin intolerant   Hypertension 06/06/2018   Low level of high density lipoprotein (HDL) 06/06/2018   Type 2 diabetes mellitus (HCC) 06/06/2018    Current Medications: Current Meds  Medication Sig   aspirin EC 81 MG  tablet Take 1 tablet (81 mg total) by mouth daily. Swallow whole.   ezetimibe (ZETIA) 10 MG tablet Take 1 tablet (10 mg total) by mouth daily.   metoprolol tartrate (LOPRESSOR) 50 MG tablet Take 1 tablet (50 mg total) by mouth once for 1 dose. Please take this medication 2 hours before CT.      EKGs/Labs/Other Studies Reviewed:    The following studies were reviewed today:  Cardiac Studies & Procedures   ______________________________________________________________________________________________          CT SCANS  CT CARDIAC SCORING (SELF PAY ONLY)  05/26/2021  Addendum 05/26/2021 12:51 PM ADDENDUM REPORT: 05/26/2021 12:49  CLINICAL DATA:  Cardiovascular Disease Risk stratification  EXAM: Coronary Calcium Score  TECHNIQUE: A gated, non-contrast computed tomography scan of the heart was performed using 3mm slice thickness. Axial images were analyzed on a dedicated workstation. Calcium scoring of the coronary arteries was performed using the Agatston method.  FINDINGS: Coronary arteries: Normal origins.  Coronary Calcium Score:  Left main: 63  Left anterior descending artery: 4  Left circumflex artery: 0  Right coronary artery: 0  Total: 67  Percentile: 46  Pericardium: Normal.  Ascending Aorta: Normal caliber.  Scattered atherosclerosis.  Non-cardiac: See separate report from [facility].  IMPRESSION: 1. Coronary calcium score of 67. This was 71 percentile for age-, race-, and sex-matched controls.  2.  Aortic atherosclerosis.  RECOMMENDATIONS: Coronary artery calcium (CAC) score is a strong predictor of incident coronary heart disease (CHD) and provides predictive information beyond traditional risk factors. CAC scoring is reasonable to use in the decision to withhold, postpone, or initiate statin therapy in intermediate-risk or selected borderline-risk asymptomatic adults (age 35-75 years and LDL-C >=70 to <190 mg/dL) who do not have diabetes or established atherosclerotic cardiovascular disease (ASCVD).* In intermediate-risk (10-year ASCVD risk >=7.5% to <20%) adults or selected borderline-risk (10-year ASCVD risk >=5% to <7.5%) adults in whom a CAC score is measured for the purpose of making a treatment decision the following recommendations have been made:  If CAC=0, it is reasonable to withhold statin therapy and reassess in 5 to 10 years, as long as higher risk conditions are absent (diabetes mellitus, family history of premature CHD in first degree relatives (males <55 years; females <65  years), cigarette smoking, or LDL >=190 mg/dL).  If CAC is 1 to 99, it is reasonable to initiate statin therapy for patients >=25 years of age.  If CAC is >=100 or >=75th percentile, it is reasonable to initiate statin therapy at any age.  Cardiology referral should be considered for patients with CAC scores >=400 or >=75th percentile.  *2018 AHA/ACC/AACVPR/AAPA/ABC/ACPM/ADA/AGS/APhA/ASPC/NLA/PCNA Guideline on the Management of Blood Cholesterol: A Report of the American College of Cardiology/American Heart Association Task Force on Clinical Practice Guidelines. J Am Coll Cardiol. 2019;73(24):3168-3209.  Oneil Parchment, MD   Electronically Signed By: Oneil Parchment MD On: 05/26/2021 12:49  Narrative EXAM: OVER-READ INTERPRETATION  CT CHEST  The following report is an over-read performed by radiologist Dr. Franky Crease of [facility] on 05/26/2021. This over-read does not include interpretation of cardiac or coronary anatomy or pathology. The coronary calcium score interpretation by the cardiologist is attached.  COMPARISON:  09/19/2014  FINDINGS: Vascular: Heart is normal size. Aorta normal caliber. Scattered aortic calcifications.  Mediastinum/Nodes: No adenopathy  Lungs/Pleura: No confluent opacities or effusions.  Upper Abdomen: Imaging into the upper abdomen demonstrates no acute findings.  Musculoskeletal: Chest wall soft tissues are unremarkable. No acute bony abnormality.  IMPRESSION: No  acute extra cardiac abnormality.  Scattered aortic atherosclerosis.  Electronically Signed: By: Franky Crease M.D. On: 05/26/2021 10:15     ______________________________________________________________________________________________      EKG Interpretation Date/Time:  Tuesday October 14 2024 08:40:54 EDT Ventricular Rate:  61 PR Interval:  178 QRS Duration:  146 QT Interval:  402 QTC Calculation: 404 R Axis:   -43  Text Interpretation: Normal  sinus rhythm Left axis deviation Right bundle branch block Left ventricular hypertrophy Abnormal ECG When compared with ECG of 27-Sep-2023 08:07, Nonspecific T wave abnormality now evident in Anterior leads Confirmed by Monetta Rogue ([id number]) on 10/14/2024 8:59:22 AM   Recent Labs: 10/23/2023: ALT 20; BUN 12; Creatinine, Ser 0.73; Potassium 4.8; Sodium 141  Recent Lipid Panel    Component Value Date/Time   CHOL 218 (H) 10/23/2023 0831   TRIG 94 10/23/2023 0831   HDL 41 10/23/2023 0831   CHOLHDL 5.3 (H) 10/23/2023 0831   LDLCALC 160 (H) 10/23/2023 0831    Physical Exam:    VS:  BP 132/76   Pulse 61   Ht 5' 6 (1.676 m)   Wt 154 lb 12.8 oz (70.2 kg)   SpO2 97%   BMI 24.99 kg/m     Wt Readings from Last 3 Encounters:  10/14/24 154 lb 12.8 oz (70.2 kg)  09/27/23 151 lb 12.8 oz (68.9 kg)  07/19/22 157 lb (71.2 kg)     GEN:  Well nourished, well developed in no acute distress HEENT: Normal NECK: No JVD; No carotid bruits LYMPHATICS: No lymphadenopathy CARDIAC: RRR, no murmurs, rubs, gallops RESPIRATORY:  Clear to auscultation without rales, wheezing or rhonchi  ABDOMEN: Soft, non-tender, non-distended MUSCULOSKELETAL:  No edema; No deformity  SKIN: Warm and dry NEUROLOGIC:  Alert and oriented x 3 PSYCHIATRIC:  Normal affect    Signed, Rogue Monetta, MD  10/14/2024 9:47 AM    [facility]

## 2024-10-14 ENCOUNTER — Ambulatory Visit: Attending: Cardiology | Admitting: Cardiology

## 2024-10-14 VITALS — BP 132/76 | HR 61 | Ht 66.0 in | Wt 154.8 lb

## 2024-10-14 DIAGNOSIS — E785 Hyperlipidemia, unspecified: Secondary | ICD-10-CM | POA: Diagnosis not present

## 2024-10-14 DIAGNOSIS — I4510 Unspecified right bundle-branch block: Secondary | ICD-10-CM | POA: Diagnosis not present

## 2024-10-14 DIAGNOSIS — R072 Precordial pain: Secondary | ICD-10-CM

## 2024-10-14 DIAGNOSIS — Z789 Other specified health status: Secondary | ICD-10-CM | POA: Diagnosis not present

## 2024-10-14 DIAGNOSIS — I10 Essential (primary) hypertension: Secondary | ICD-10-CM

## 2024-10-14 DIAGNOSIS — I6522 Occlusion and stenosis of left carotid artery: Secondary | ICD-10-CM

## 2024-10-14 DIAGNOSIS — R931 Abnormal findings on diagnostic imaging of heart and coronary circulation: Secondary | ICD-10-CM

## 2024-10-14 MED ORDER — ASPIRIN 81 MG PO TBEC: 81.0000 mg | DELAYED_RELEASE_TABLET | Freq: Every day | ORAL | 3 refills | Status: AC

## 2024-10-14 MED ORDER — METOPROLOL TARTRATE 50 MG PO TABS: 50.0000 mg | ORAL_TABLET | Freq: Once | ORAL | 0 refills | Status: AC

## 2024-10-14 MED ORDER — EZETIMIBE 10 MG PO TABS: 10.0000 mg | ORAL_TABLET | Freq: Every day | ORAL | 3 refills | Status: AC

## 2024-10-14 NOTE — Patient Instructions (Signed)
 Medication Instructions:  Your physician has recommended you make the following change in your medication:   START: Zetia 10 mg daily START: Aspirin 81 mg daily  *If you need a refill on your cardiac medications before your next appointment, please call your pharmacy*  Lab Work: Your physician recommends that you return for lab work in:   Labs today: BMP Labs in 2 months: Lipids, Apo B  If you have labs (blood work) drawn today and your tests are completely normal, you will receive your results only by: MyChart Message (if you have MyChart) OR A paper copy in the mail If you have any lab test that is abnormal or we need to change your treatment, we will call you to review the results.  Testing/Procedures:   Your cardiac CT will be scheduled at one of the below locations:   [facility] [address] [place], [place] [address] [phone] (Severe contrast allergies only)  OR   [facility] [address] [place], [place] [address] [phone]  OR   [facility] [address] [place], [place] [address] [phone]  OR   [facility] and Vascular Tower [address]  [place], [place] [address]  OR   [facility] [address] [place], [place] [phone]  If scheduled at [facility], please arrive at the [facility] and Children's Entrance (Entrance C2) of [facility] 30 minutes prior to test start time. You can use the FREE valet parking offered at entrance C (encouraged to control the heart rate for the test)  Proceed to the [facility] Radiology Department (first floor) to check-in and test prep.  All radiology patients and guests should use entrance C2 at [facility], accessed from [facility], even though the hospital's physical address listed is [address].  If scheduled at the Heart and Vascular Tower at [address], please enter the  parking lot using the [address] entrance and use the FREE valet service at the patient drop-off area. Enter the building and check-in with registration on the main floor.  If scheduled at [facility], please arrive to the Heart and Vascular Center 15 mins early for check-in and test prep.  There is spacious parking and easy access to the radiology department from the [facility] Heart and Vascular entrance. Please enter here and check-in with the desk attendant.   If scheduled at [facility], please arrive 30 minutes early for check-in and test prep.  Please follow these instructions carefully (unless otherwise directed):  An IV will be required for this test and Nitroglycerin will be given.  Hold all erectile dysfunction medications at least 3 days (72 hrs) prior to test. (Ie viagra, cialis, sildenafil, tadalafil, etc)   On the Night Before the Test: Be sure to Drink plenty of water. Do not consume any caffeinated/decaffeinated beverages or chocolate 12 hours prior to your test. Do not take any antihistamines 12 hours prior to your test.  On the Day of the Test: Drink plenty of water until 1 hour prior to the test. Do not eat any food 1 hour prior to test. You may take your regular medications prior to the test.  Take metoprolol (Lopressor) two hours prior to test. Patients who wear a continuous glucose monitor MUST remove the device prior to scanning.      After the Test: Drink plenty of water. After receiving IV contrast, you may  experience a mild flushed feeling. This is normal. On occasion, you may experience a mild rash up to 24 hours after the test. This is not dangerous. If this occurs, you can take Benadryl 25 mg, Zyrtec, Claritin, or Allegra and increase your fluid intake. (Patients taking Tikosyn should avoid Benadryl, and may take Zyrtec, Claritin, or Allegra) If you experience trouble breathing, this can be serious. If it is severe call 911  IMMEDIATELY. If it is mild, please call our office.  We will call to schedule your test 2-4 weeks out understanding that some insurance companies will need an authorization prior to the service being performed.   For more information and frequently asked questions, please visit our website : [email or web address]  For non-scheduling related questions, please contact the cardiac imaging nurse navigator should you have any questions/concerns: Cardiac Imaging Nurse Navigators Direct Office Dial: [phone]   For scheduling needs, including cancellations and rescheduling, please call Brittany, [phone].   Follow-Up: At [facility], you and your health needs are our priority.  As part of our continuing mission to provide you with exceptional heart care, our providers are all part of one team.  This team includes your primary Cardiologist (physician) and Advanced Practice Providers or APPs (Physician Assistants and Nurse Practitioners) who all work together to provide you with the care you need, when you need it.  Your next appointment:   1 year(s)  Provider:   Redell Leiter, MD    We recommend signing up for the patient portal called MyChart.  Sign up information is provided on this After Visit Summary.  MyChart is used to connect with patients for Virtual Visits (Telemedicine).  Patients are able to view lab/test results, encounter notes, upcoming appointments, etc.  Non-urgent messages can be sent to your provider as well.   To learn more about what you can do with MyChart, go to [email or web address].   Other Instructions None

## 2024-10-15 ENCOUNTER — Ambulatory Visit: Payer: Self-pay | Admitting: Cardiology

## 2024-10-15 LAB — BASIC METABOLIC PANEL WITH GFR
BUN/Creatinine Ratio: 16 (ref 10–24)
BUN: 12 mg/dL (ref 8–27)
CO2: 23 mmol/L (ref 20–29)
Calcium: 10.1 mg/dL (ref 8.6–10.2)
Chloride: 103 mmol/L (ref 96–106)
Creatinine, Ser: 0.73 mg/dL — ABNORMAL LOW (ref 0.76–1.27)
Glucose: 90 mg/dL (ref 70–99)
Potassium: 4.6 mmol/L (ref 3.5–5.2)
Sodium: 139 mmol/L (ref 134–144)
eGFR: 98 mL/min/1.73 (ref >59)

## 2024-11-18 ENCOUNTER — Encounter (HOSPITAL_COMMUNITY): Payer: Self-pay

## 2024-11-19 ENCOUNTER — Telehealth (HOSPITAL_COMMUNITY): Payer: Self-pay | Admitting: Emergency Medicine

## 2024-11-19 NOTE — Telephone Encounter (Signed)
 Reaching out to patient to offer assistance regarding upcoming cardiac imaging study; pt verbalizes understanding of appt date/time, parking situation and where to check in, pre-test NPO status and medications ordered, and verified current allergies; name and call back number provided for further questions should they arise Rockwell Alexandria RN Navigator Cardiac Imaging [facility] [phone] office [phone] cell

## 2024-11-19 NOTE — Telephone Encounter (Signed)
 Attempted to call patient regarding upcoming cardiac CT appointment. Left message on voicemail with name and callback number Rockwell Alexandria RN Navigator Cardiac Imaging [facility] Vascular Services [phone] Office [phone] Cell

## 2024-11-20 ENCOUNTER — Inpatient Hospital Stay (HOSPITAL_BASED_OUTPATIENT_CLINIC_OR_DEPARTMENT_OTHER)
Admission: RE | Admit: 2024-11-20 | Discharge: 2024-11-20 | Disposition: A | Source: Ambulatory Visit | Attending: Cardiology | Admitting: Cardiology

## 2024-11-20 ENCOUNTER — Other Ambulatory Visit: Payer: Self-pay

## 2024-11-20 ENCOUNTER — Inpatient Hospital Stay (INDEPENDENT_AMBULATORY_CARE_PROVIDER_SITE_OTHER)
Admission: RE | Admit: 2024-11-20 | Discharge: 2024-11-20 | Disposition: A | Payer: Self-pay | Source: Ambulatory Visit | Attending: Cardiology | Admitting: Cardiology

## 2024-11-20 ENCOUNTER — Other Ambulatory Visit: Payer: Self-pay | Admitting: Cardiology

## 2024-11-20 DIAGNOSIS — R931 Abnormal findings on diagnostic imaging of heart and coronary circulation: Secondary | ICD-10-CM

## 2024-11-20 DIAGNOSIS — R072 Precordial pain: Secondary | ICD-10-CM

## 2024-11-20 MED ORDER — NITROGLYCERIN 0.4 MG SL SUBL
0.8000 mg | SUBLINGUAL_TABLET | Freq: Once | SUBLINGUAL | Status: AC
  Administered 2024-11-20: 0.8 mg via SUBLINGUAL

## 2024-11-20 MED ORDER — IOHEXOL 350 MG/ML SOLN
100.0000 mL | Freq: Once | INTRAVENOUS | Status: AC | PRN
  Administered 2024-11-20: 100 mL via INTRAVENOUS

## 2024-11-20 MED ORDER — NITROGLYCERIN 0.4 MG SL SUBL: 0.4000 mg | SUBLINGUAL_TABLET | SUBLINGUAL | 1 refills | Status: AC | PRN

## 2024-11-20 NOTE — Progress Notes (Unsigned)
 FFR order

## 2024-11-24 ENCOUNTER — Ambulatory Visit: Payer: Self-pay | Admitting: Cardiology

## 2024-11-26 ENCOUNTER — Telehealth: Payer: Self-pay

## 2024-11-26 NOTE — Telephone Encounter (Signed)
 Left message on My Chart with CT Scan results per Dr. Karry note. Routed to PCP.

## 2024-11-26 NOTE — Telephone Encounter (Signed)
 Pt viewed CT results on My Chart per Dr. Vanetta Shawl note. Routed to PCP.

## 2024-11-27 DIAGNOSIS — R931 Abnormal findings on diagnostic imaging of heart and coronary circulation: Secondary | ICD-10-CM | POA: Diagnosis not present

## 2024-11-27 DIAGNOSIS — R7301 Impaired fasting glucose: Secondary | ICD-10-CM | POA: Diagnosis not present

## 2024-11-27 DIAGNOSIS — Z125 Encounter for screening for malignant neoplasm of prostate: Secondary | ICD-10-CM | POA: Diagnosis not present
